# Patient Record
Sex: Female | Born: 1990 | Race: Black or African American | Hispanic: No | Marital: Single | Smoking: Never smoker
Health system: Southern US, Community
[De-identification: ages and names within clinical notes are randomized; demographics above are authoritative.]

## PROBLEM LIST (undated history)

## (undated) DIAGNOSIS — F32A Depression, unspecified: Secondary | ICD-10-CM

## (undated) DIAGNOSIS — R87629 Unspecified abnormal cytological findings in specimens from vagina: Secondary | ICD-10-CM

## (undated) DIAGNOSIS — G43909 Migraine, unspecified, not intractable, without status migrainosus: Secondary | ICD-10-CM

## (undated) DIAGNOSIS — O24419 Gestational diabetes mellitus in pregnancy, unspecified control: Secondary | ICD-10-CM

## (undated) DIAGNOSIS — F419 Anxiety disorder, unspecified: Secondary | ICD-10-CM

## (undated) HISTORY — PX: GALLBLADDER SURGERY: SHX652

## (undated) HISTORY — PX: TONSILECTOMY, ADENOIDECTOMY, BILATERAL MYRINGOTOMY AND TUBES: SHX2538

## (undated) HISTORY — PX: CHOLECYSTECTOMY: SHX55

## (undated) HISTORY — DX: Depression, unspecified: F32.A

## (undated) HISTORY — DX: Migraine, unspecified, not intractable, without status migrainosus: G43.909

## (undated) HISTORY — DX: Gestational diabetes mellitus in pregnancy, unspecified control: O24.419

## (undated) HISTORY — DX: Unspecified abnormal cytological findings in specimens from vagina: R87.629

## (undated) HISTORY — DX: Anxiety disorder, unspecified: F41.9

---

## 2018-09-02 DIAGNOSIS — K047 Periapical abscess without sinus: Secondary | ICD-10-CM | POA: Diagnosis not present

## 2018-09-14 DIAGNOSIS — Z113 Encounter for screening for infections with a predominantly sexual mode of transmission: Secondary | ICD-10-CM | POA: Diagnosis not present

## 2018-11-29 DIAGNOSIS — N94819 Vulvodynia, unspecified: Secondary | ICD-10-CM | POA: Diagnosis not present

## 2018-12-12 DIAGNOSIS — N94819 Vulvodynia, unspecified: Secondary | ICD-10-CM | POA: Diagnosis not present

## 2018-12-17 ENCOUNTER — Other Ambulatory Visit: Payer: Self-pay

## 2018-12-17 ENCOUNTER — Encounter (HOSPITAL_COMMUNITY): Payer: Self-pay | Admitting: Emergency Medicine

## 2018-12-17 ENCOUNTER — Ambulatory Visit (HOSPITAL_COMMUNITY)
Admission: EM | Admit: 2018-12-17 | Discharge: 2018-12-17 | Disposition: A | Discharge status: Home / Self Care | Payer: Medicaid Other | Attending: Family Medicine | Admitting: Family Medicine

## 2018-12-17 DIAGNOSIS — G43109 Migraine with aura, not intractable, without status migrainosus: Secondary | ICD-10-CM | POA: Diagnosis not present

## 2018-12-17 MED ORDER — NAPROXEN 500 MG PO TABS: 500.0000 mg | ORAL_TABLET | Freq: Two times a day (BID) | ORAL | 0 refills | Status: DC

## 2018-12-17 MED ORDER — METOCLOPRAMIDE HCL 5 MG/ML IJ SOLN
5.0000 mg | Freq: Once | INTRAMUSCULAR | Status: AC
  Administered 2018-12-17: 5 mg via INTRAMUSCULAR

## 2018-12-17 MED ORDER — KETOROLAC TROMETHAMINE 60 MG/2ML IM SOLN
60.0000 mg | Freq: Once | INTRAMUSCULAR | Status: AC
  Administered 2018-12-17: 60 mg via INTRAMUSCULAR

## 2018-12-17 MED ORDER — METOCLOPRAMIDE HCL 5 MG/ML IJ SOLN
INTRAMUSCULAR | Status: AC
  Filled 2018-12-17: qty 2

## 2018-12-17 MED ORDER — DEXAMETHASONE SODIUM PHOSPHATE 10 MG/ML IJ SOLN
10.0000 mg | Freq: Once | INTRAMUSCULAR | Status: AC
  Administered 2018-12-17: 10 mg via INTRAMUSCULAR

## 2018-12-17 MED ORDER — DEXAMETHASONE SODIUM PHOSPHATE 10 MG/ML IJ SOLN
INTRAMUSCULAR | Status: AC
  Filled 2018-12-17: qty 1

## 2018-12-17 MED ORDER — KETOROLAC TROMETHAMINE 60 MG/2ML IM SOLN
INTRAMUSCULAR | Status: AC
  Filled 2018-12-17: qty 2

## 2018-12-17 NOTE — ED Triage Notes (Signed)
Per pt she has hx migraines and takes prescription for them. Pt says no relief and also sensitivity to light . Feeling nausea

## 2018-12-17 NOTE — Discharge Instructions (Addendum)
We gave you an injection of Toradol, Decadron and Reglan today for your headache.  They should begin working in 30 to 40 minutes.  You may try Naprosyn twice daily as alternative to your diclofenac.  Please establish care with primary care in order to obtain neurology referral to follow-up.  Follow-up here in the emergency room if headache worsening, changing

## 2018-12-17 NOTE — ED Provider Notes (Signed)
[facility]    CSN: [id number] Arrival date & time: 12/17/18  1040     History   Chief Complaint Chief Complaint  Patient presents with  . Headache    HPI Tanya Mccullough is a 28 y.o. female history of migraines presenting today for evaluation of a headache.  Patient states that beginning yesterday she had a headache on her right frontal region.  When she woke up she had discomfort and migraine on her left frontal region.  She reports associated aura prior to onset.  She has had some mild nausea yesterday, but no persistent nausea or vomiting today.  She has had associated photo phobia.  Also notes some left arm numbness and diarrhea but states that this is typical for her migraines.  She is previously seen by neurology and currently on diclofenac.  She took diclofenac 25 mg at various times throughout the day yesterday without relief.  She is tried ibuprofen 800 mg last night.  She is not on any preventative medicines.  States that headache and symptoms very similar to when she has had headaches in the past.  Notes that she has had headaches off and on since she was 28 years old.  Last headache was 5 months ago.  Denies any vision changes besides the aura preceding headache but this is not been continuous.  Denies numbness or tingling.  Denies chest pain or shortness of breath.  Denies weakness.  HPI  History reviewed. No pertinent past medical history.  There are no active problems to display for this patient.   History reviewed. No pertinent surgical history.  OB History   No obstetric history on file.      Home Medications    Prior to Admission medications   Medication Sig Start Date End Date Taking? Authorizing Provider  naproxen (NAPROSYN) 500 MG tablet Take 1 tablet (500 mg total) by mouth 2 (two) times daily. 12/17/18   , Junius Creamer, PA-C    Family History No family history on file.  Social History Social History   Tobacco Use  . Smoking status: Not  on file  Substance Use Topics  . Alcohol use: Not on file  . Drug use: Not on file     Allergies   Patient has no allergy information on record.   Review of Systems Review of Systems  Constitutional: Negative for fatigue and fever.  HENT: Negative for congestion, sinus pressure and sore throat.   Eyes: Positive for photophobia. Negative for pain and visual disturbance.  Respiratory: Negative for cough and shortness of breath.   Cardiovascular: Negative for chest pain.  Gastrointestinal: Negative for abdominal pain, nausea and vomiting.  Genitourinary: Negative for decreased urine volume and hematuria.  Musculoskeletal: Negative for myalgias, neck pain and neck stiffness.  Neurological: Positive for headaches. Negative for dizziness, syncope, facial asymmetry, speech difficulty, weakness, light-headedness and numbness.     Physical Exam Triage Vital Signs ED Triage Vitals  Enc Vitals Group     BP 12/17/18 1055 112/80     Pulse Rate 12/17/18 1055 78     Resp 12/17/18 1055 16     Temp 12/17/18 1055 99.2 F (37.3 C)     Temp Source 12/17/18 1055 Oral     SpO2 12/17/18 1055 96 %     Weight --      Height --      Head Circumference --      Peak Flow --      Pain Score 12/17/18  1058 8     Pain Loc --      Pain Edu? --      Excl. in GC? --    No data found.  Updated Vital Signs BP 112/80 (BP Location: Left Arm)   Pulse 78   Temp 99.2 F (37.3 C) (Oral)   Resp 16   LMP 11/21/2018   SpO2 96%   Visual Acuity Right Eye Distance:   Left Eye Distance:   Bilateral Distance:    Right Eye Near:   Left Eye Near:    Bilateral Near:     Physical Exam Vitals signs and nursing note reviewed.  Constitutional:      General: She is not in acute distress.    Appearance: She is well-developed.  HENT:     Head: Normocephalic and atraumatic.     Ears:     Comments: Bilateral ears without tenderness to palpation of external auricle, tragus and mastoid, EAC's without  erythema or swelling, TM's with good bony landmarks and cone of light. Non erythematous.     Mouth/Throat:     Comments: Oral mucosa pink and moist, no tonsillar enlargement or exudate. Posterior pharynx patent and nonerythematous, no uvula deviation or swelling. Normal phonation. Palate elevating symmetrically Eyes:     Extraocular Movements: Extraocular movements intact.     Conjunctiva/sclera: Conjunctivae normal.     Pupils: Pupils are equal, round, and reactive to light.     Comments: Mild photophobia with exam  Neck:     Musculoskeletal: Neck supple.  Cardiovascular:     Rate and Rhythm: Normal rate and regular rhythm.     Heart sounds: No murmur.  Pulmonary:     Effort: Pulmonary effort is normal. No respiratory distress.     Breath sounds: Normal breath sounds.  Abdominal:     Palpations: Abdomen is soft.     Tenderness: There is no abdominal tenderness.  Skin:    General: Skin is warm and dry.  Neurological:     General: No focal deficit present.     Mental Status: She is alert and oriented to person, place, and time. Mental status is at baseline.     Cranial Nerves: No cranial nerve deficit.     Motor: No weakness.     Comments: Patient A&O x3, cranial nerves II-XII grossly intact, strength at shoulders, hips and knees 5/5, equal bilaterally, patellar reflex 2+ bilaterally.  Gait without abnormality.      UC Treatments / Results  Labs (all labs ordered are listed, but only abnormal results are displayed) Labs Reviewed - No data to display  EKG None  Radiology No results found.  Procedures Procedures (including critical care time)  Medications Ordered in UC Medications  ketorolac (TORADOL) injection 60 mg (60 mg Intramuscular Given 12/17/18 1211)  metoCLOPramide (REGLAN) injection 5 mg (5 mg Intramuscular Given 12/17/18 1215)  dexamethasone (DECADRON) injection 10 mg (10 mg Intramuscular Given 12/17/18 1213)    Initial Impression / Assessment and Plan / UC  Course  I have reviewed the triage vital signs and the nursing notes.  Pertinent labs & imaging results that were available during my care of the patient were reviewed by me and considered in my medical decision making (see chart for details).     Headache similar to previous headaches, vital signs stable, no neuro deficits.  No red flags.  Will treat headache as migraine with Toradol, Decadron and Reglan in clinic.  Will provide Naprosyn as alternative to use for  further manage take management at home.  Discussed establishing care with primary care/neurology for further follow-up that she recently moved here.  Following up in emergency room if headache changing or worsening, feeling different from typical headaches.  Discussed strict return precautions. Patient verbalized understanding and is agreeable with plan.  Final Clinical Impressions(s) / UC Diagnoses   Final diagnoses:  Migraine with aura and without status migrainosus, not intractable     Discharge Instructions     We gave you an injection of Toradol, Decadron and Reglan today for your headache.  They should begin working in 30 to 40 minutes.  You may try Naprosyn twice daily as alternative to your diclofenac.  Please establish care with primary care in order to obtain neurology referral to follow-up.  Follow-up here in the emergency room if headache worsening, changing   ED Prescriptions    Medication Sig Dispense Auth. Provider   naproxen (NAPROSYN) 500 MG tablet Take 1 tablet (500 mg total) by mouth 2 (two) times daily. 30 tablet , Melia C, PA-C     Controlled Substance Prescriptions [place] Controlled Substance Registry consulted? Not Applicable   Lew Dawes,  C, [place] 12/17/18 1400

## 2018-12-22 ENCOUNTER — Emergency Department (HOSPITAL_COMMUNITY)
Admission: EM | Admit: 2018-12-22 | Discharge: 2018-12-22 | Disposition: A | Discharge status: Home / Self Care | Payer: Medicaid Other | Attending: Emergency Medicine | Admitting: Emergency Medicine

## 2018-12-22 ENCOUNTER — Encounter: Payer: Self-pay | Admitting: Family Medicine

## 2018-12-22 ENCOUNTER — Other Ambulatory Visit: Payer: Self-pay

## 2018-12-22 ENCOUNTER — Encounter (HOSPITAL_COMMUNITY): Payer: Self-pay | Admitting: *Deleted

## 2018-12-22 DIAGNOSIS — R51 Headache: Secondary | ICD-10-CM | POA: Diagnosis present

## 2018-12-22 DIAGNOSIS — G43809 Other migraine, not intractable, without status migrainosus: Secondary | ICD-10-CM | POA: Diagnosis not present

## 2018-12-22 LAB — BASIC METABOLIC PANEL
Anion gap: 12 (ref 5–15)
BUN: 8 mg/dL (ref 6–20)
CO2: 23 mmol/L (ref 22–32)
Calcium: 9.4 mg/dL (ref 8.9–10.3)
Chloride: 104 mmol/L (ref 98–111)
Creatinine, Ser: 0.7 mg/dL (ref 0.44–1.00)
GFR calc Af Amer: >60 mL/min (ref >60)
GFR calc non Af Amer: >60 mL/min (ref >60)
Glucose, Bld: 83 mg/dL (ref 70–99)
Potassium: 3.8 mmol/L (ref 3.5–5.1)
Sodium: 139 mmol/L (ref 135–145)

## 2018-12-22 LAB — CBC WITH DIFFERENTIAL/PLATELET
Abs Immature Granulocytes: 0.03 10*3/uL (ref 0.00–0.07)
Basophils Absolute: 0.1 10*3/uL (ref 0.0–0.1)
Basophils Relative: 1 %
Eosinophils Absolute: 0.4 10*3/uL (ref 0.0–0.5)
Eosinophils Relative: 4 %
HCT: 40.7 % (ref 36.0–46.0)
Hemoglobin: 14.6 g/dL (ref 12.0–15.0)
Immature Granulocytes: 0 %
Lymphocytes Relative: 38 %
Lymphs Abs: 4 10*3/uL (ref 0.7–4.0)
MCH: 28.3 pg (ref 26.0–34.0)
MCHC: 35.9 g/dL (ref 30.0–36.0)
MCV: 78.9 fL — ABNORMAL LOW (ref 80.0–100.0)
Monocytes Absolute: 0.7 10*3/uL (ref 0.1–1.0)
Monocytes Relative: 7 %
Neutro Abs: 5.4 10*3/uL (ref 1.7–7.7)
Neutrophils Relative %: 50 %
Platelets: 251 10*3/uL (ref 150–400)
RBC: 5.16 MIL/uL — ABNORMAL HIGH (ref 3.87–5.11)
RDW: 13.5 % (ref 11.5–15.5)
WBC: 10.6 10*3/uL — ABNORMAL HIGH (ref 4.0–10.5)
nRBC: 0 % (ref 0.0–0.2)

## 2018-12-22 LAB — I-STAT BETA HCG BLOOD, ED (MC, WL, AP ONLY): I-stat hCG, quantitative: <5 m[IU]/mL (ref <5)

## 2018-12-22 MED ORDER — DIPHENHYDRAMINE HCL 50 MG/ML IJ SOLN
25.0000 mg | Freq: Once | INTRAMUSCULAR | Status: AC
  Administered 2018-12-22: 21:00:00 25 mg via INTRAVENOUS
  Filled 2018-12-22: qty 1

## 2018-12-22 MED ORDER — METOCLOPRAMIDE HCL 5 MG/ML IJ SOLN
10.0000 mg | Freq: Once | INTRAMUSCULAR | Status: AC
  Administered 2018-12-22: 21:00:00 10 mg via INTRAVENOUS
  Filled 2018-12-22: qty 2

## 2018-12-22 MED ORDER — SODIUM CHLORIDE 0.9 % IV BOLUS
1000.0000 mL | Freq: Once | INTRAVENOUS | Status: AC
  Administered 2018-12-22: 21:00:00 1000 mL via INTRAVENOUS

## 2018-12-22 MED ORDER — KETOROLAC TROMETHAMINE 30 MG/ML IJ SOLN
30.0000 mg | Freq: Once | INTRAMUSCULAR | Status: AC
  Administered 2018-12-22: 21:00:00 30 mg via INTRAVENOUS
  Filled 2018-12-22: qty 1

## 2018-12-22 MED ORDER — DEXAMETHASONE SODIUM PHOSPHATE 10 MG/ML IJ SOLN
10.0000 mg | Freq: Once | INTRAMUSCULAR | Status: AC
  Administered 2018-12-22: 21:00:00 10 mg via INTRAVENOUS
  Filled 2018-12-22: qty 1

## 2018-12-22 NOTE — ED Provider Notes (Signed)
[facility]   CSN: [id number] Arrival date & time: 12/22/18  1713    History   Chief Complaint Chief Complaint  Patient presents with  . Migraine    HPI Tanya Mccullough is a 28 y.o. female.     Patient is a 28 year old female with past medical history of migraine headaches since the age of 89.  Patient presents today with complaints of headache.  It started this morning and has been unrelieved with diclofenac.  This was prescribed for her at urgent care earlier this week.  Patient reports stabbing pain to the left side of her head.  She denies any visual disturbances.  She denies any fevers, chills, or stiff neck.  The history is provided by the patient.  Migraine  This is a recurrent problem. Episode onset: this morning. The problem occurs constantly. The problem has been rapidly worsening. Associated symptoms include headaches. Nothing aggravates the symptoms. Nothing relieves the symptoms. She has tried nothing for the symptoms. The treatment provided no relief.    Past Medical History:  Diagnosis Date  . Migraines     There are no active problems to display for this patient.   Past Surgical History:  Procedure Laterality Date  . CHOLECYSTECTOMY    . TONSILECTOMY, ADENOIDECTOMY, BILATERAL MYRINGOTOMY AND TUBES       OB History   No obstetric history on file.      Home Medications    Prior to Admission medications   Medication Sig Start Date End Date Taking? Authorizing Provider  naproxen (NAPROSYN) 500 MG tablet Take 1 tablet (500 mg total) by mouth 2 (two) times daily. 12/17/18   Wieters, Junius CreamerHallie C, PA-C    Family History No family history on file.  Social History Social History   Tobacco Use  . Smoking status: Never Smoker  . Smokeless tobacco: Never Used  Substance Use Topics  . Alcohol use: Yes  . Drug use: Not on file     Allergies   Prochlorperazine   Review of Systems Review of Systems   Neurological: Positive for headaches.  All other systems reviewed and are negative.    Physical Exam Updated Vital Signs BP 121/74 (BP Location: Right Arm)   Pulse 70   Temp 98.5 F (36.9 C) (Oral)   Resp 16   Ht 5\' 7"  (1.702 m)   Wt 69.9 kg   LMP 12/22/2018 (Exact Date)   SpO2 100%   BMI 24.12 kg/m   Physical Exam Vitals signs and nursing note reviewed.  Constitutional:      General: She is not in acute distress.    Appearance: She is well-developed. She is not diaphoretic.  HENT:     Head: Normocephalic and atraumatic.  Eyes:     Extraocular Movements: Extraocular movements intact.     Pupils: Pupils are equal, round, and reactive to light.  Neck:     Musculoskeletal: Normal range of motion and neck supple.  Cardiovascular:     Rate and Rhythm: Normal rate and regular rhythm.     Heart sounds: No murmur. No friction rub. No gallop.   Pulmonary:     Effort: Pulmonary effort is normal. No respiratory distress.     Breath sounds: Normal breath sounds. No wheezing.  Abdominal:     General: Bowel sounds are normal. There is no distension.     Palpations: Abdomen is soft.     Tenderness: There is no abdominal tenderness.  Musculoskeletal: Normal range  of motion.  Skin:    General: Skin is warm and dry.  Neurological:     General: No focal deficit present.     Mental Status: She is alert and oriented to person, place, and time.     Cranial Nerves: No cranial nerve deficit.     Sensory: No sensory deficit.     Motor: No weakness.     Coordination: Coordination normal.      ED Treatments / Results  Labs (all labs ordered are listed, but only abnormal results are displayed) Labs Reviewed  BASIC METABOLIC PANEL  CBC WITH DIFFERENTIAL/PLATELET  I-STAT BETA HCG BLOOD, ED ([facility], [facility], [facility] ONLY)    EKG None  Radiology No results found.  Procedures Procedures (including critical care time)  Medications Ordered in ED Medications  sodium chloride 0.9 % bolus  1,000 mL (has no administration in time range)  ketorolac (TORADOL) 30 MG/ML injection 30 mg (has no administration in time range)  metoCLOPramide (REGLAN) injection 10 mg (has no administration in time range)  diphenhydrAMINE (BENADRYL) injection 25 mg (has no administration in time range)  dexamethasone (DECADRON) injection 10 mg (has no administration in time range)     Initial Impression / Assessment and Plan / ED Course  I have reviewed the triage vital signs and the nursing notes.  Pertinent labs & imaging results that were available during my care of the patient were reviewed by me and considered in my medical decision making (see chart for details).  Patient presenting with complaints of headache.  This is consistent with her prior migraines, however not improving with usual measures.  She was given a migraine cocktail and IV fluids and is feeling much better.  At this point I see no indication for imaging or further work-up.  Patient will be discharged and is to follow-up as needed.  Final Clinical Impressions(s) / ED Diagnoses   Final diagnoses:  None    ED Discharge Orders    None       Geoffery Lyons, MD 12/22/18 2258

## 2018-12-22 NOTE — Discharge Instructions (Addendum)
Continue medications as previously prescribed.  Return to the ER for worsening headache, high fever, or other new and concerning symptoms.

## 2018-12-22 NOTE — ED Triage Notes (Signed)
Pt in c/o migraine onset since Friday, pt seen at [facility], pt reports waking up this morning at 7am and was not able to see out of R eye with hx of the same with her migraines, pt denies relief with Naproxen, pt sees a neurologist for migraines, pt denies current n/v/d, A&O x4, no facial droop, no slurred speech, A&O x4

## 2018-12-27 ENCOUNTER — Telehealth: Payer: Self-pay | Admitting: Family Medicine

## 2018-12-27 ENCOUNTER — Telehealth: Payer: Medicaid Other | Admitting: Family Medicine

## 2018-12-27 ENCOUNTER — Inpatient Hospital Stay: Payer: Medicaid Other | Admitting: Family Medicine

## 2018-12-27 ENCOUNTER — Telehealth (INDEPENDENT_AMBULATORY_CARE_PROVIDER_SITE_OTHER): Payer: Medicaid Other | Admitting: Family Medicine

## 2018-12-27 DIAGNOSIS — G43109 Migraine with aura, not intractable, without status migrainosus: Secondary | ICD-10-CM

## 2018-12-27 DIAGNOSIS — G4459 Other complicated headache syndrome: Secondary | ICD-10-CM | POA: Diagnosis not present

## 2018-12-27 MED ORDER — SUMATRIPTAN SUCCINATE 100 MG PO TABS: 100.0000 mg | ORAL_TABLET | ORAL | 2 refills | Status: DC | PRN

## 2018-12-27 MED ORDER — TOPIRAMATE 25 MG PO TABS: 25.0000 mg | ORAL_TABLET | Freq: Every day | ORAL | 1 refills | Status: DC

## 2018-12-27 NOTE — Progress Notes (Signed)
Virtual Visit via Video Note  I connected with Tanya Mccullough on 12/27/18 at  2:30 PM EDT by a video enabled telemedicine application and verified that I am speaking with the correct person using two identifiers.  Location: Patient: Located at home during today's encounter  Provider: Located at primary care office    I discussed the limitations of evaluation and management by telemedicine and the availability of in person appointments. The patient expressed understanding and agreed to proceed.  History of Present Illness: Followed by neurologist in [facility] at [facility] previously.  Recently moved to [place].  Has had migraines since she was 28 years old.  Most recent medication regimen consisted of gabapentin 3 times daily she stopped after experiencing episodes of memory loss which continues to occur since being off medication for over several months.  She is previously was prescribed Topamax felt the medication worked, patient was discontinued to start her on gabapentin which was thought to be more effective.  She is also been on Maxalt for headache management.  She is never been on Imitrex.  She was scheduled for a CT however this was canceled due to COVID.  She is concerned as she feels these headaches are different than the headaches that she has had previously.  She presented to the ER on 12/22/18 with a complaint of a left sided headaches which she describes as a stabbing pain. She was prescribed Diclofenac for headache relief by a previous provider but reports medication did not relieve pain. While in the ER she was administered a headache cocktail which relieved symptoms temporarily but endorses daily frequent headaches which occur regardless of activity. She endorses feeling the memory is "off" and doesn't feel like herself and endorses that this recent set of headaches are different than previous.  Assessment and Plan: 1. Migraine with aura and without status  migrainosus, not intractable Obtaining a CT of head without contrast.  Restart preventative headache treatment with low dose Topamax 25 mg once daily.  Will trial sumatriptan 100 mg every 2 hours not to exceed 200 mg within 24 hours for abortive treatment of headaches. - Ambulatory referral to Neurology  2. Other complicated headache syndrome - CT Head Wo Contrast; Future  Follow Up Instructions: CPE with fasting labs 02/14/19-PAP if needed    I discussed the assessment and treatment plan with the patient. The patient was provided an opportunity to ask questions and all were answered. The patient agreed with the plan and demonstrated an understanding of the instructions.   The patient was advised to call back or seek an in-person evaluation if the symptoms worsen or if the condition fails to improve as anticipated.  I provided 25 minutes of non-face-to-face time during this encounter.   Joaquin Courts, FNP

## 2018-12-27 NOTE — Telephone Encounter (Signed)
Please schedule CT of head w/o contrast.  Order placed in system. Patient has not preference of day. Locations: [facility] or [facility]

## 2018-12-28 MED ORDER — TOPIRAMATE 25 MG PO TABS: 25.0000 mg | ORAL_TABLET | Freq: Every day | ORAL | 1 refills | Status: DC

## 2018-12-28 NOTE — Telephone Encounter (Signed)
Case requires additional clinical review. [employer] is requesting office note be faxed to [phone]. Case # [id number].  Once your note is finished I can fax.

## 2018-12-28 NOTE — Telephone Encounter (Signed)
Called [employer]([phone]) to initiate a PA for CT.

## 2018-12-29 ENCOUNTER — Encounter: Payer: Self-pay | Admitting: *Deleted

## 2018-12-29 ENCOUNTER — Telehealth: Payer: Self-pay | Admitting: *Deleted

## 2018-12-29 NOTE — Telephone Encounter (Signed)
Office note completed.

## 2018-12-29 NOTE — Telephone Encounter (Signed)
Confirmation received. Called [employer] to verify receipt. It is being reviewed by the clinical nurse.

## 2018-12-29 NOTE — Telephone Encounter (Signed)
Sent MyChart message to patient to let her know the status of imaging PA.

## 2018-12-29 NOTE — Telephone Encounter (Signed)
Spoke with patient and updated EMR. 

## 2018-12-29 NOTE — Telephone Encounter (Signed)
Office note faxed to [employer]([phone]). Awaiting confirmation.

## 2019-01-02 ENCOUNTER — Other Ambulatory Visit: Payer: Self-pay

## 2019-01-02 ENCOUNTER — Ambulatory Visit (INDEPENDENT_AMBULATORY_CARE_PROVIDER_SITE_OTHER): Payer: Medicaid Other | Admitting: Diagnostic Neuroimaging

## 2019-01-02 ENCOUNTER — Encounter: Payer: Self-pay | Admitting: Diagnostic Neuroimaging

## 2019-01-02 ENCOUNTER — Telehealth: Payer: Self-pay | Admitting: Diagnostic Neuroimaging

## 2019-01-02 DIAGNOSIS — G4489 Other headache syndrome: Secondary | ICD-10-CM | POA: Diagnosis not present

## 2019-01-02 MED ORDER — GALCANEZUMAB-GNLM 120 MG/ML ~~LOC~~ SOAJ: 120.0000 mg | SUBCUTANEOUS | 4 refills | Status: DC

## 2019-01-02 MED ORDER — RIZATRIPTAN BENZOATE 10 MG PO TBDP: 10.0000 mg | ORAL_TABLET | ORAL | 11 refills | Status: DC | PRN

## 2019-01-02 NOTE — Telephone Encounter (Signed)
Patient seen by neurology today. Neurologist cancelled CT and ordered MRI.

## 2019-01-02 NOTE — Telephone Encounter (Signed)
medicaid order sent to GI. They will obtain the auth and reach out to the pt to schedule.  °

## 2019-01-02 NOTE — Progress Notes (Signed)
[facility]  PATIENT: Tanya Mccullough DOB: 08/20/1990  REFERRING CLINICIAN: Colin Mulders, FNP HISTORY FROM: patient  REASON FOR VISIT: new consult    HISTORICAL  CHIEF COMPLAINT:  No chief complaint on file.   HISTORY OF PRESENT ILLNESS:   28 year old female here for evaluation of headaches.  Patient has had headaches since age 60 years old with unilateral throbbing pounding severe headaches.  These are associated with nausea, photophobia and phonophobia.  Also associated with confusion and slurred speech.  These are oftentimes preceded by seeing flashing lights in one eye and unilateral numbness in the face and arm.  Headaches can last hours at a time.  She was living in [place] previously and tried on Topamax, gabapentin, Imitrex, diclofenac with mild relief.  He was having some cognitive side effects with migraine prevention medicines and gradually came off these medicines 5 to 6 months ago.  In last 1 month patient has had increasing, recurrent headaches, with change in severity and features.  Now patient having some muscle twitching and daily headaches.  Patient has tried Topamax and Imitrex without relief.  She went to the emergency room for evaluation.  Patient is almost having daily headaches now.    REVIEW OF SYSTEMS: Full 14 system review of systems performed and negative with exception of: As per HPI.  ALLERGIES: Allergies  Allergen Reactions  . Prochlorperazine Other (See Comments)    Lip twitching    HOME MEDICATIONS: Outpatient Medications Prior to Visit  Medication Sig Dispense Refill  . naproxen (NAPROSYN) 500 MG tablet Take 1 tablet (500 mg total) by mouth 2 (two) times daily. 30 tablet 0  . SUMAtriptan (IMITREX) 100 MG tablet Take 1 tablet (100 mg total) by mouth every 2 (two) hours as needed for migraine (Do not exceed 200 mg within 24 hours.). May repeat in 2 hours if headache persists or recurs. 10 tablet 2  . topiramate (TOPAMAX) 25 MG  tablet Take 1 tablet (25 mg total) by mouth daily. 90 tablet 1   No facility-administered medications prior to visit.     PAST MEDICAL HISTORY: Past Medical History:  Diagnosis Date  . Migraines     PAST SURGICAL HISTORY: Past Surgical History:  Procedure Laterality Date  . CHOLECYSTECTOMY    . TONSILECTOMY, ADENOIDECTOMY, BILATERAL MYRINGOTOMY AND TUBES      FAMILY HISTORY: Family History  Problem Relation Age of Onset  . Healthy Mother   . Other Father        murdered  . Migraines Cousin     SOCIAL HISTORY: Social History   Socioeconomic History  . Marital status: Single    Spouse name: Not on file  . Number of children: 2  . Years of education: Not on file  . Highest education level: Bachelor's degree (e.g., BA, AB, BS)  Occupational History    Comment: medical admin  Social Needs  . Financial resource strain: Not on file  . Food insecurity:    Worry: Not on file    Inability: Not on file  . Transportation needs:    Medical: Not on file    Non-medical: Not on file  Tobacco Use  . Smoking status: Never Smoker  . Smokeless tobacco: Never Used  Substance and Sexual Activity  . Alcohol use: Yes    Comment: 1 glass every days  . Drug use: Never  . Sexual activity: Not on file  Lifestyle  . Physical activity:    Days per week: Not on file  Minutes per session: Not on file  . Stress: Not on file  Relationships  . Social connections:    Talks on phone: Not on file    Gets together: Not on file    Attends religious service: Not on file    Active member of club or organization: Not on file    Attends meetings of clubs or organizations: Not on file    Relationship status: Not on file  . Intimate partner violence:    Fear of current or ex partner: Not on file    Emotionally abused: Not on file    Physically abused: Not on file    Forced sexual activity: Not on file  Other Topics Concern  . Not on file  Social History Narrative   Lives with 2 sons    Caffeine- rare soda     PHYSICAL EXAM   VIDEO EXAM  GENERAL EXAM/CONSTITUTIONAL:  Vitals: There were no vitals filed for this visit.  There is no height or weight on file to calculate BMI. Wt Readings from Last 3 Encounters:  12/22/18 154 lb (69.9 kg)     Patient is in no distress; well developed, nourished and groomed; neck is supple   NEUROLOGIC: MENTAL STATUS:  No flowsheet data found.  awake, alert, oriented to person, place and time  recent and remote memory intact  normal attention and concentration  language fluent, comprehension intact, naming intact  fund of knowledge appropriate  CRANIAL NERVE:   2nd, 3rd, 4th, 6th - visual fields full to confrontation, extraocular muscles intact, no nystagmus  5th - facial sensation symmetric  7th - facial strength symmetric  8th - hearing intact  11th - shoulder shrug symmetric  12th - tongue protrusion midline  MOTOR:   NO TREMOR; NO DRIFT IN BUE  SENSORY:   normal and symmetric to light touch  COORDINATION:   fine finger movements normal    DIAGNOSTIC DATA (LABS, IMAGING, TESTING) - I reviewed patient records, labs, notes, testing and imaging myself where available.  Lab Results  Component Value Date   WBC 10.6 (H) 12/22/2018   HGB 14.6 12/22/2018   HCT 40.7 12/22/2018   MCV 78.9 (L) 12/22/2018   PLT 251 12/22/2018      Component Value Date/Time   NA 139 12/22/2018 2030   K 3.8 12/22/2018 2030   CL 104 12/22/2018 2030   CO2 23 12/22/2018 2030   GLUCOSE 83 12/22/2018 2030   BUN 8 12/22/2018 2030   CREATININE 0.70 12/22/2018 2030   CALCIUM 9.4 12/22/2018 2030   GFRNONAA >60 12/22/2018 2030   GFRAA >60 12/22/2018 2030   No results found for: CHOL, HDL, LDLCALC, LDLDIRECT, TRIG, CHOLHDL No results found for: WJXB1Y No results found for: VITAMINB12 No results found for: TSH    ASSESSMENT AND PLAN  28 y.o. year old female here with migraine with aura since age 73 years old; now  with increasing and changing type of headaches.   Meds tried: topiramate, gabapentin, diclofenac, sumatriptan  Dx: migraine with aura  1. Other headache syndrome    Virtual Visit via Video Note  I connected with Tanya Mccullough on 01/02/19 at  9:00 AM EDT by a video enabled telemedicine application and verified that I am speaking with the correct person using two identifiers.  Location: Patient: home  Provider: office   I discussed the limitations of evaluation and management by telemedicine and the availability of in person appointments. The patient expressed understanding and agreed to proceed.  I discussed the assessment and treatment plan with the patient. The patient was provided an opportunity to ask questions and all were answered. The patient agreed with the plan and demonstrated an understanding of the instructions.   The patient was advised to call back or seek an in-person evaluation if the symptoms worsen or if the condition fails to improve as anticipated.  I provided 30 minutes of non-face-to-face time during this encounter.    PLAN:  - check MRI brain (increasing, worsening HA) - will cancel previously ordered CT  - MIGRAINE PREVENTION --> start emgality monthly injections  - MIGRAINE RESCUE --> rizatriptan 10mg  as needed for breakthrough headache; may repeat x 1 after 2 hours; max 2 tabs per day or 8 per month   Orders Placed This Encounter  Procedures  . MR BRAIN W WO CONTRAST   Meds ordered this encounter  Medications  . Galcanezumab-gnlm (EMGALITY) 120 MG/ML SOAJ    Sig: Inject 120 mg into the skin every 30 (thirty) days.    Dispense:  3 pen    Refill:  4  . rizatriptan (MAXALT-MLT) 10 MG disintegrating tablet    Sig: Take 1 tablet (10 mg total) by mouth as needed for migraine. May repeat in 2 hours if needed    Dispense:  9 tablet    Refill:  11   Return in about 3 months (around 04/04/2019) for with NP (Amy Lomax).    Suanne MarkerVIKRAM R. Mariel Lukins, MD  01/02/2019, 9:23 AM Certified in Neurology, Neurophysiology and Neuroimaging  [facility] [address], [address] [place], [place] [id number] [phone]

## 2019-01-02 NOTE — Telephone Encounter (Signed)
Prior Authorization was denied.  Request was denied b/c the requested imaging is not supported for the evaluation of a primary headache disorder in the absence of focal neurological deficits. The clinical information provided does not describe a condition other than a primary headache disorder and, therefore, the request is not indicated at this time.

## 2019-01-04 ENCOUNTER — Telehealth: Payer: Self-pay | Admitting: *Deleted

## 2019-01-04 NOTE — Telephone Encounter (Signed)
Received fax from [facility]: rizatriptan needs PA. [place] [employer] Triptan PA form filled out, on dr [employer] for review, signature.

## 2019-01-10 NOTE — Telephone Encounter (Signed)
Faxed Rizatriptan PA form to [facility].

## 2019-01-12 NOTE — Telephone Encounter (Addendum)
Per [place] [employer] web site, rizatriptan was denied. I called patient to advise. She stated that the pharmacist called her a few days ago and advised insurance probably denied triptan because she had recently picked up sumatriptan. Patient stated she is going to talk to pharmacist today, return sumatriptan and have pharmacist try to fill rizatriptan. She will call back if she has any issues. She verbalized understanding, appreciation.

## 2019-01-20 ENCOUNTER — Ambulatory Visit
Admission: RE | Admit: 2019-01-20 | Discharge: 2019-01-20 | Disposition: A | Discharge status: Home / Self Care | Payer: Medicaid Other | Source: Ambulatory Visit | Attending: Diagnostic Neuroimaging | Admitting: Diagnostic Neuroimaging

## 2019-01-20 ENCOUNTER — Other Ambulatory Visit: Payer: Self-pay

## 2019-01-20 DIAGNOSIS — G4489 Other headache syndrome: Secondary | ICD-10-CM

## 2019-01-20 MED ORDER — GADOBENATE DIMEGLUMINE 529 MG/ML IV SOLN
6.0000 mL | Freq: Once | INTRAVENOUS | Status: AC | PRN
  Administered 2019-01-20: 6 mL via INTRAVENOUS

## 2019-01-23 NOTE — Telephone Encounter (Signed)
Medicaid Josem Kaufmann: [id number] (exp. 01/17/19 to 07/16/19) patient had mri at GI on 01/20/19.

## 2019-02-02 ENCOUNTER — Telehealth: Payer: Self-pay | Admitting: Diagnostic Neuroimaging

## 2019-02-02 NOTE — Telephone Encounter (Addendum)
[facility], spoke with pharmacist Sarah and advised her of patient's call. She stated the patient picked up 3 pens on  01/02/19. The patient should have 1 pen to inject today; the next injection will be due July 18th. Judson Roch stated the patient is trying to refill too soon, and since the original Rx did not specify to take two injections the first month, insurance is denying the refill. Judson Roch took a [facility] for 2 pens and advised I call patient. She will need to call [facility] in  [facility] July for next refill. At that time [facility] can use verbal Rx from today, then pick up on previous refills.  I called patient and advised her of above conversation. She verbalized understanding, appreciation.

## 2019-02-02 NOTE — Telephone Encounter (Signed)
Pt has called re: her Galcanezumab-gnlm (EMGALITY) 120 MG/ML SOAJ.  She states her 1st dose required that she take 2 at 1 time.  Pt states the pharmacy told her to call the office and have something sent to them because she would not be able to get a refill this soon.  Please call

## 2019-02-14 ENCOUNTER — Ambulatory Visit: Payer: Medicaid Other | Admitting: Family Medicine

## 2019-02-14 ENCOUNTER — Telehealth: Payer: Self-pay

## 2019-02-14 NOTE — Telephone Encounter (Signed)
Called patient to do their pre-visit COVID screening.  Patient states that her menstrual cycle started this morning & she would like to reschedule appointment once cycle has ended so that she doesn't have to make multiple trips.

## 2019-02-15 ENCOUNTER — Ambulatory Visit: Payer: Medicaid Other | Admitting: Family Medicine

## 2019-02-28 ENCOUNTER — Telehealth: Payer: Self-pay | Admitting: *Deleted

## 2019-02-28 NOTE — Telephone Encounter (Signed)
LVM informing patient her MRI brain results are unremarkable. I requested she call back and schedule her FU with A Lomax, NP.

## 2019-04-11 ENCOUNTER — Ambulatory Visit: Payer: Medicaid Other | Admitting: Family Medicine

## 2019-04-11 NOTE — Progress Notes (Deleted)
PATIENT: Tanya Mccullough DOB: 07/12/1991  REASON FOR VISIT: follow up HISTORY FROM: patient  No chief complaint on file.    HISTORY OF PRESENT ILLNESS: Today 04/11/19 Tanya Mccullough is a 28 y.o. female here today for follow up for headches  She continues topiramate 25mg  daily as well as Emgality monthly. She is using rizatriptan for abortive therapy.    HISTORY: (copied from Dr Richrd HumblesPenumalli's note on 01/02/2019)  [age] year old female here for evaluation of headaches.  Patient has had headaches since age 28 years old with unilateral throbbing pounding severe headaches.  These are associated with nausea, photophobia and phonophobia.  Also associated with confusion and slurred speech.  These are oftentimes preceded by seeing flashing lights in one eye and unilateral numbness in the face and arm.  Headaches can last hours at a time.  She was living in [place] previously and tried on Topamax, gabapentin, Imitrex, diclofenac with mild relief.  He was having some cognitive side effects with migraine prevention medicines and gradually came off these medicines 5 to 6 months ago.  In last 1 month patient has had increasing, recurrent headaches, with change in severity and features.  Now patient having some muscle twitching and daily headaches.  Patient has tried Topamax and Imitrex without relief.  She went to the emergency room for evaluation.  Patient is almost having daily headaches now.   REVIEW OF SYSTEMS: Out of a complete 14 system review of symptoms, the patient complains only of the following symptoms, and all other reviewed systems are negative.  ALLERGIES: Allergies  Allergen Reactions  . Prochlorperazine Other (See Comments)    Lip twitching    HOME MEDICATIONS: Outpatient Medications Prior to Visit  Medication Sig Dispense Refill  . Galcanezumab-gnlm (EMGALITY) 120 MG/ML SOAJ Inject 120 mg into the skin every 30 (thirty) days. 3 pen 4  . naproxen (NAPROSYN) 500 MG tablet  Take 1 tablet (500 mg total) by mouth 2 (two) times daily. 30 tablet 0  . rizatriptan (MAXALT-MLT) 10 MG disintegrating tablet Take 1 tablet (10 mg total) by mouth as needed for migraine. May repeat in 2 hours if needed 9 tablet 11  . SUMAtriptan (IMITREX) 100 MG tablet Take 1 tablet (100 mg total) by mouth every 2 (two) hours as needed for migraine (Do not exceed 200 mg within 24 hours.). May repeat in 2 hours if headache persists or recurs. 10 tablet 2  . topiramate (TOPAMAX) 25 MG tablet Take 1 tablet (25 mg total) by mouth daily. 90 tablet 1   No facility-administered medications prior to visit.     PAST MEDICAL HISTORY: Past Medical History:  Diagnosis Date  . Migraines     PAST SURGICAL HISTORY: Past Surgical History:  Procedure Laterality Date  . CHOLECYSTECTOMY    . TONSILECTOMY, ADENOIDECTOMY, BILATERAL MYRINGOTOMY AND TUBES      FAMILY HISTORY: Family History  Problem Relation Age of Onset  . Healthy Mother   . Other Father        murdered  . Migraines Cousin     SOCIAL HISTORY: Social History   Socioeconomic History  . Marital status: Single    Spouse name: Not on file  . Number of children: 2  . Years of education: Not on file  . Highest education level: Bachelor's degree (e.g., BA, AB, BS)  Occupational History    Comment: medical admin  Social Needs  . Financial resource strain: Not on file  . Food insecurity    Worry: Not on  file    Inability: Not on file  . Transportation needs    Medical: Not on file    Non-medical: Not on file  Tobacco Use  . Smoking status: Never Smoker  . Smokeless tobacco: Never Used  Substance and Sexual Activity  . Alcohol use: Yes    Comment: 1 glass every days  . Drug use: Never  . Sexual activity: Not on file  Lifestyle  . Physical activity    Days per week: Not on file    Minutes per session: Not on file  . Stress: Not on file  Relationships  . Social Musician on phone: Not on file    Gets  together: Not on file    Attends religious service: Not on file    Active member of club or organization: Not on file    Attends meetings of clubs or organizations: Not on file    Relationship status: Not on file  . Intimate partner violence    Fear of current or ex partner: Not on file    Emotionally abused: Not on file    Physically abused: Not on file    Forced sexual activity: Not on file  Other Topics Concern  . Not on file  Social History Narrative   Lives with 2 sons   Caffeine- rare soda      PHYSICAL EXAM  There were no vitals filed for this visit. There is no height or weight on file to calculate BMI.  Generalized: Well developed, in no acute distress  Cardiology: normal rate and rhythm, no murmur noted Neurological examination  Mentation: Alert oriented to time, place, history taking. Follows all commands speech and language fluent Cranial nerve II-XII: Pupils were equal round reactive to light. Extraocular movements were full, visual field were full on confrontational test. Facial sensation and strength were normal. Uvula tongue midline. Head turning and shoulder shrug  were normal and symmetric. Motor: The motor testing reveals 5 over 5 strength of all 4 extremities. Good symmetric motor tone is noted throughout.  Sensory: Sensory testing is intact to soft touch on all 4 extremities. No evidence of extinction is noted.  Coordination: Cerebellar testing reveals good finger-nose-finger and heel-to-shin bilaterally.  Gait and station: Gait is normal. Tandem gait is normal. Romberg is negative. No drift is seen.  Reflexes: Deep tendon reflexes are symmetric and normal bilaterally.   DIAGNOSTIC DATA (LABS, IMAGING, TESTING) - I reviewed patient records, labs, notes, testing and imaging myself where available.  No flowsheet data found.   Lab Results  Component Value Date   WBC 10.6 (H) 12/22/2018   HGB 14.6 12/22/2018   HCT 40.7 12/22/2018   MCV 78.9 (L) 12/22/2018    PLT 251 12/22/2018      Component Value Date/Time   NA 139 12/22/2018 2030   K 3.8 12/22/2018 2030   CL 104 12/22/2018 2030   CO2 23 12/22/2018 2030   GLUCOSE 83 12/22/2018 2030   BUN 8 12/22/2018 2030   CREATININE 0.70 12/22/2018 2030   CALCIUM 9.4 12/22/2018 2030   GFRNONAA >60 12/22/2018 2030   GFRAA >60 12/22/2018 2030   No results found for: CHOL, HDL, LDLCALC, LDLDIRECT, TRIG, CHOLHDL No results found for: LSLH7D No results found for: VITAMINB12 No results found for: TSH     ASSESSMENT AND PLAN 28 y.o. year old female  has a past medical history of Migraines. here with ***  No diagnosis found.     No orders  of the defined types were placed in this encounter.    No orders of the defined types were placed in this encounter.     I spent 15 minutes with the patient. 50% of this time was spent counseling and educating patient on plan of care and medications.    Debbora Presto, FNP-C 04/11/2019, 12:56 PM [facility] [address], [place], [place] [address] [phone]

## 2019-04-12 ENCOUNTER — Other Ambulatory Visit: Payer: Self-pay | Admitting: Diagnostic Neuroimaging

## 2019-04-18 ENCOUNTER — Telehealth (INDEPENDENT_AMBULATORY_CARE_PROVIDER_SITE_OTHER): Payer: Medicaid Other | Admitting: Family Medicine

## 2019-04-18 ENCOUNTER — Encounter: Payer: Self-pay | Admitting: Family Medicine

## 2019-04-18 VITALS — Ht 67.0 in

## 2019-04-18 DIAGNOSIS — G4489 Other headache syndrome: Secondary | ICD-10-CM

## 2019-04-18 MED ORDER — EMGALITY 120 MG/ML ~~LOC~~ SOSY: 120.0000 mg | PREFILLED_SYRINGE | SUBCUTANEOUS | 11 refills | Status: DC

## 2019-04-18 NOTE — Progress Notes (Signed)
   PATIENT: Tanya Mccullough DOB: 01/28/1991  REASON FOR VISIT: follow up HISTORY FROM: patient  Virtual Visit via Telephone Note  I connected with Tanya Mccullough on 04/18/19 at  9:30 AM EDT by telephone and verified that I am speaking with the correct person using two identifiers.   I discussed the limitations, risks, security and privacy concerns of performing an evaluation and management service by telephone and the availability of in person appointments. I also discussed with the patient that there may be a patient responsible charge related to this service. The patient expressed understanding and agreed to proceed.   History of Present Illness:  04/18/19 Tanya Mccullough is a 28 y.o. female here today for follow up for migraines. She was started on Emgality and rizatriptan at last visit. She has had complete resolution of her migraines. No headaches in over 2 months. She took rizatriptan once and it worked well. No adverse effects with medications. She is feeling well and without concerns today.   History (copied from Dr Gladstone Lighter note on 01/02/2019)  28 year old female here for evaluation of headaches.  Patient has had headaches since age 45 years old with unilateral throbbing pounding severe headaches.  These are associated with nausea, photophobia and phonophobia.  Also associated with confusion and slurred speech.  These are oftentimes preceded by seeing flashing lights in one eye and unilateral numbness in the face and arm.  Headaches can last hours at a time.  She was living in [place] previously and tried on Topamax, gabapentin, Imitrex, diclofenac with mild relief.  He was having some cognitive side effects with migraine prevention medicines and gradually came off these medicines 5 to 6 months ago.  In last 1 month patient has had increasing, recurrent headaches, with change in severity and features.  Now patient having some muscle twitching and daily headaches.  Patient has  tried Topamax and Imitrex without relief.  She went to the emergency room for evaluation.  Patient is almost having daily headaches now.   Observations/Objective:  Generalized: Well developed, in no acute distress  Mentation: Alert oriented to time, place, history taking. Follows all commands speech and language fluent   Assessment and Plan:  28 y.o. year old female  has a past medical history of Migraines. here with    ICD-10-CM   1. Other headache syndrome  G44.89    She is doing very well on Emgality and rizatriptan. She has had complete resolution of headaches. We will continue current treatment plan. Follow up advised annually.  Appointment has been scheduled with patient today.  She verbalizes understanding and agreement with this plan.    No orders of the defined types were placed in this encounter.   No orders of the defined types were placed in this encounter.    Follow Up Instructions:  I discussed the assessment and treatment plan with the patient. The patient was provided an opportunity to ask questions and all were answered. The patient agreed with the plan and demonstrated an understanding of the instructions.   The patient was advised to call back or seek an in-person evaluation if the symptoms worsen or if the condition fails to improve as anticipated.  I provided 15 minutes of non-face-to-face time during this encounter.  Patient is located at her place of residence during my chart visit.  Provider is located in the office.   Debbora Presto, NP

## 2019-04-18 NOTE — Addendum Note (Signed)
Addended byUbaldo Glassing, Christy Friede L on: 04/18/2019 01:01 PM   Modules accepted: Orders

## 2019-04-18 NOTE — Progress Notes (Signed)
I reviewed note and agree with plan.   Penni Bombard, MD 08/23/7937, 0:30 PM Certified in Neurology, Neurophysiology and Neuroimaging  [facility] [address], [place], [place] [address] [phone]

## 2019-05-12 ENCOUNTER — Other Ambulatory Visit: Payer: Self-pay

## 2019-05-12 ENCOUNTER — Encounter (HOSPITAL_COMMUNITY): Payer: Self-pay

## 2019-05-12 ENCOUNTER — Ambulatory Visit (HOSPITAL_COMMUNITY)
Admission: EM | Admit: 2019-05-12 | Discharge: 2019-05-12 | Disposition: A | Discharge status: Home / Self Care | Payer: Managed Care, Other (non HMO) | Attending: Family Medicine | Admitting: Family Medicine

## 2019-05-12 ENCOUNTER — Encounter: Payer: Self-pay | Admitting: Family Medicine

## 2019-05-12 DIAGNOSIS — G43111 Migraine with aura, intractable, with status migrainosus: Secondary | ICD-10-CM

## 2019-05-12 MED ORDER — METOCLOPRAMIDE HCL 5 MG/ML IJ SOLN
INTRAMUSCULAR | Status: AC
  Filled 2019-05-12: qty 2

## 2019-05-12 MED ORDER — DEXAMETHASONE SODIUM PHOSPHATE 10 MG/ML IJ SOLN
10.0000 mg | Freq: Once | INTRAMUSCULAR | Status: AC
  Administered 2019-05-12: 10 mg via INTRAMUSCULAR

## 2019-05-12 MED ORDER — DEXAMETHASONE SODIUM PHOSPHATE 10 MG/ML IJ SOLN
INTRAMUSCULAR | Status: AC
  Filled 2019-05-12: qty 1

## 2019-05-12 MED ORDER — KETOROLAC TROMETHAMINE 60 MG/2ML IM SOLN
60.0000 mg | Freq: Once | INTRAMUSCULAR | Status: AC
  Administered 2019-05-12: 60 mg via INTRAMUSCULAR

## 2019-05-12 MED ORDER — METOCLOPRAMIDE HCL 5 MG/ML IJ SOLN
5.0000 mg | Freq: Once | INTRAMUSCULAR | Status: AC
  Administered 2019-05-12: 5 mg via INTRAMUSCULAR

## 2019-05-12 MED ORDER — KETOROLAC TROMETHAMINE 60 MG/2ML IM SOLN
INTRAMUSCULAR | Status: AC
  Filled 2019-05-12: qty 2

## 2019-05-12 NOTE — Discharge Instructions (Addendum)
Treated you here with a migraine cocktail. Follow-up with your neurologist

## 2019-05-12 NOTE — ED Triage Notes (Signed)
Pt presents with recurrent migraine with photo sensitivity and nausea X 3 days.

## 2019-05-12 NOTE — ED Provider Notes (Signed)
[facility]    CSN: [id number] Arrival date & time: 05/12/19  1309      History   Chief Complaint Chief Complaint  Patient presents with  . Appointment  . (1:30) Migraine    HPI Tanya Mccullough is a 28 y.o. female.   Patient is a 28 year old female with past medical history of migraines.  She is presenting today with a migraine.  Reports that this is been present and worsening over the past 3 days.  She has been taking rizatriptan but the headache still remains.  She has had some photophobia, nausea and intermittent numbness and tingling.  This is pretty consistent with her migraines.  She contacted her neurologist but has not heard back.  Mild frontal headache with dizziness.  Some sinus congestion.  No fever, cough, chest congestion.   ROS per HPI      Past Medical History:  Diagnosis Date  . Migraines     There are no active problems to display for this patient.   Past Surgical History:  Procedure Laterality Date  . CHOLECYSTECTOMY    . TONSILECTOMY, ADENOIDECTOMY, BILATERAL MYRINGOTOMY AND TUBES      OB History   No obstetric history on file.      Home Medications    Prior to Admission medications   Medication Sig Start Date End Date Taking? Authorizing Provider  Galcanezumab-gnlm (EMGALITY) 120 MG/ML SOSY Inject 120 mg into the skin every 30 (thirty) days. 04/18/19   Lomax, Amy, NP  naproxen (NAPROSYN) 500 MG tablet Take 1 tablet (500 mg total) by mouth 2 (two) times daily. 12/17/18   Wieters, Hallie C, PA-C  rizatriptan (MAXALT-MLT) 10 MG disintegrating tablet Take 1 tablet (10 mg total) by mouth as needed for migraine. May repeat in 2 hours if needed 01/02/19   Penumalli, Earlean Polka, MD    Family History Family History  Problem Relation Age of Onset  . Healthy Mother   . Other Father        murdered  . Migraines Cousin     Social History Social History   Tobacco Use  . Smoking status: Never Smoker  . Smokeless tobacco: Never Used   Substance Use Topics  . Alcohol use: Yes    Comment: 1 glass every days  . Drug use: Never     Allergies   Prochlorperazine   Review of Systems Review of Systems   Physical Exam Triage Vital Signs ED Triage Vitals [05/12/19 1329]  Enc Vitals Group     BP 124/81     Pulse Rate 66     Resp 17     Temp 98.6 F (37 C)     Temp Source Oral     SpO2 100 %     Weight      Height      Head Circumference      Peak Flow      Pain Score 9     Pain Loc      Pain Edu?      Excl. in [place]?    No data found.  Updated Vital Signs BP 124/81 (BP Location: Left Arm)   Pulse 66   Temp 98.6 F (37 C) (Oral)   Resp 17   LMP 05/07/2019   SpO2 100%   Visual Acuity Right Eye Distance:   Left Eye Distance:   Bilateral Distance:    Right Eye Near:   Left Eye Near:    Bilateral Near:  Physical Exam Vitals signs and nursing note reviewed.  Constitutional:      General: She is not in acute distress.    Appearance: Normal appearance. She is not ill-appearing, toxic-appearing or diaphoretic.     Comments: Very pleasant in no distress.   HENT:     Head: Normocephalic and atraumatic.     Nose: Nose normal.     Mouth/Throat:     Pharynx: Oropharynx is clear.  Eyes:     Extraocular Movements: Extraocular movements intact.     Conjunctiva/sclera: Conjunctivae normal.     Pupils: Pupils are equal, round, and reactive to light.  Neck:     Musculoskeletal: Normal range of motion.  Pulmonary:     Effort: Pulmonary effort is normal.  Musculoskeletal: Normal range of motion.  Skin:    General: Skin is warm and dry.  Neurological:     General: No focal deficit present.     Mental Status: She is alert.     Comments: Strength 5 out of 5 in all extremities.  No focal neuro deficits. Speech clear and without facial droop.  Psychiatric:        Mood and Affect: Mood normal.      UC Treatments / Results  Labs (all labs ordered are listed, but only abnormal results are  displayed) Labs Reviewed - No data to display  EKG   Radiology No results found.  Procedures Procedures (including critical care time)  Medications Ordered in UC Medications  ketorolac (TORADOL) injection 60 mg (60 mg Intramuscular Given 05/12/19 1413)  metoCLOPramide (REGLAN) injection 5 mg (5 mg Intramuscular Given 05/12/19 1413)  dexamethasone (DECADRON) injection 10 mg (10 mg Intramuscular Given 05/12/19 1413)  dexamethasone (DECADRON) 10 MG/ML injection (has no administration in time range)  ketorolac (TORADOL) 60 MG/2ML injection (has no administration in time range)  metoCLOPramide (REGLAN) 5 MG/ML injection (has no administration in time range)    Initial Impression / Assessment and Plan / UC Course  I have reviewed the triage vital signs and the nursing notes.  Pertinent labs & imaging results that were available during my care of the patient were reviewed by me and considered in my medical decision making (see chart for details).     Persistent migraine- treating here with migraine cocktail.  No focal neuro deficits.  No concerning signs or symptoms for CVA  Patient feeling much better after cocktail with headache 1 out of 10. Recommended follow-up with her primary care or neurologist for further problems.  Final Clinical Impressions(s) / UC Diagnoses   Final diagnoses:  Intractable migraine with aura with status migrainosus     Discharge Instructions     Treated you here with a migraine cocktail. Follow-up with your neurologist    ED Prescriptions    None     PDMP not reviewed this encounter.   Janace Aris, NP 05/12/19 1450

## 2019-05-15 ENCOUNTER — Encounter: Payer: Self-pay | Admitting: Family Medicine

## 2019-05-15 ENCOUNTER — Telehealth: Payer: Self-pay | Admitting: Family Medicine

## 2019-05-15 NOTE — Telephone Encounter (Signed)
I called pt and made offering of VV or in office visit for her tomorrow at 0930, 1030 or 1130.

## 2019-05-15 NOTE — Telephone Encounter (Signed)
Can you please get her scheduled for visit with me?

## 2019-05-15 NOTE — Telephone Encounter (Signed)
My chart message sent for pt that appt available with AL/MP 0930 or 1030.

## 2019-05-18 ENCOUNTER — Ambulatory Visit: Payer: Medicaid Other | Admitting: Family Medicine

## 2019-05-23 ENCOUNTER — Telehealth: Payer: Medicaid Other | Admitting: Family Medicine

## 2019-05-23 ENCOUNTER — Telehealth: Payer: Self-pay | Admitting: Family Medicine

## 2019-05-23 NOTE — Telephone Encounter (Signed)
Patient did not log in to MyChart visit as scheduled at 8am. I called at 8:13am. LM on VM to return call to reschedule if needed.

## 2019-05-23 NOTE — Progress Notes (Deleted)
   PATIENT: Paisley Grajeda DOB: [birth year]  REASON FOR VISIT: follow up HISTORY FROM: patient  Virtual Visit via Telephone Note  I connected with Duanne Limerick on 05/23/19 at  8:00 AM EDT by telephone and verified that I am speaking with the correct person using two identifiers.   I discussed the limitations, risks, security and privacy concerns of performing an evaluation and management service by telephone and the availability of in person appointments. I also discussed with the patient that there may be a patient responsible charge related to this service. The patient expressed understanding and agreed to proceed.   History of Present Illness:  05/23/19 Saidee Geremia is a 28 y.o. female here today for follow up for migraines. Since last being seen, she had a migraine that was persistent for . She was seen by UC after trying rizatriptan, benadryl and Aleve without relief.    History (copied from my note on 04/18/2019)  Niyana Chesbro is a 28 y.o. female here today for follow up for migraines. She was started on Emgality and rizatriptan at last visit. She has had complete resolution of her migraines. No headaches in over 2 months. She took rizatriptan once and it worked well. No adverse effects with medications. She is feeling well and without concerns today.   History (copied from Dr Gladstone Lighter note on 01/02/2019)  28 year old female here for evaluation of headaches.  Patient has had headaches since age 84 years old with unilateral throbbing pounding severe headaches. These are associated with nausea, photophobia and phonophobia. Also associated with confusion and slurred speech. These are oftentimes preceded by seeing flashing lights in one eye and unilateral numbness in the face and arm. Headaches can last hours at a time. She was living in [place] previously and tried on Topamax, gabapentin, Imitrex, diclofenac with mild relief. He was having some cognitive side effects  with migraine prevention medicines and gradually came off these medicines 5 to 6 months ago.  In last 1 month patient has had increasing, recurrent headaches, with change in severity and features. Now patient having some muscle twitching and daily headaches. Patient has tried Topamax and Imitrex without relief. She went to the emergency room for evaluation. Patient is almost having daily headaches now.    Observations/Objective:  Generalized: Well developed, in no acute distress  Mentation: Alert oriented to time, place, history taking. Follows all commands speech and language fluent   Assessment and Plan:  28 y.o. year old female  has a past medical history of Migraines. here with  No diagnosis found.  No orders of the defined types were placed in this encounter.   No orders of the defined types were placed in this encounter.    Follow Up Instructions:  I discussed the assessment and treatment plan with the patient. The patient was provided an opportunity to ask questions and all were answered. The patient agreed with the plan and demonstrated an understanding of the instructions.   The patient was advised to call back or seek an in-person evaluation if the symptoms worsen or if the condition fails to improve as anticipated.  I provided *** minutes of non-face-to-face time during this encounter.   Debbora Presto, NP

## 2019-05-29 ENCOUNTER — Telehealth: Payer: Self-pay

## 2019-05-29 NOTE — Telephone Encounter (Signed)
Called [facility] for Pts Emgality 120mg /ml syringes PA.  Reference Numbers:   First reference Number: [id number] Second reference Number: [id number]  PA Key [facility]... [id number]

## 2019-06-05 ENCOUNTER — Encounter: Payer: Self-pay | Admitting: Family Medicine

## 2019-06-06 NOTE — Telephone Encounter (Signed)
I received approval for emgality on [facility] portal this am.  PA # [id number] ID [id number] T.  05-29-19 thru 11-25-2019.  Length 180 days.  Total quantity -6.  I received fax/verbal from Judson Roch at pharmacy received approval [phone]. Emgality.  I responded to pt to let her know by mychart that was approved and sarah at pharmacy will call [facility] about not being able to process.  She will call back as needed.

## 2019-06-28 NOTE — Progress Notes (Signed)
PATIENT: Tanya Mccullough DOB: [birth year]  REASON FOR VISIT: follow up HISTORY FROM: patient  Virtual Visit via Telephone Note  I connected with Duanne Mccullough on 06/29/19 at  9:00 AM EST by telephone and verified that I am speaking with the correct person using two identifiers.   I discussed the limitations, risks, security and privacy concerns of performing an evaluation and management service by telephone and the availability of in person appointments. I also discussed with the patient that there may be a patient responsible charge related to this service. The patient expressed understanding and agreed to proceed.   History of Present Illness:  06/29/19 Tanya Mccullough is a 28 y.o. female here today for follow up for migraines. She was seen last on 9/1 and doing very well on Emgality. She had a migraine on 9/25 that was unresponsive to rizatriptan. She was given a migraine cocktail of Toradol, Reglan and Decadron. Migraine nearly resolved with treatment. About a month later, she had another migraine that was persistent for 4-5 days. She found relief with benadryl and Sudafed. She has not had any headaches since. Rizatriptan usually helps to abort migraines. She takes naproxen as needed. BP running 120/80's. No new or concerning symptoms.   Medications tried: Emgality, topiramate, gabapentin, diclofenac, Imitrex   History (copied from my note on 04/18/2019)  Tanya Mccullough is a 28 y.o. female here today for follow up for migraines. She was started on Emgality and rizatriptan at last visit. She has had complete resolution of her migraines. No headaches in over 2 months. She took rizatriptan once and it worked well. No adverse effects with medications. She is feeling well and without concerns today.   History (copied from Dr Gladstone Lighter note on 01/02/2019)  28 year old female here for evaluation of headaches.  Patient has had headaches since age 27 years old with unilateral throbbing  pounding severe headaches. These are associated with nausea, photophobia and phonophobia. Also associated with confusion and slurred speech. These are oftentimes preceded by seeing flashing lights in one eye and unilateral numbness in the face and arm. Headaches can last hours at a time. She was living in [place] previously and tried on Topamax, gabapentin, Imitrex, diclofenac with mild relief. He was having some cognitive side effects with migraine prevention medicines and gradually came off these medicines 5 to 6 months ago.  In last 1 month patient has had increasing, recurrent headaches, with change in severity and features. Now patient having some muscle twitching and daily headaches. Patient has tried Topamax and Imitrex without relief. She went to the emergency room for evaluation. Patient is almost having daily headaches now.   Observations/Objective:  Generalized: Well developed, in no acute distress  Mentation: Alert oriented to time, place, history taking. Follows all commands speech and language fluent   Assessment and Plan:  28 y.o. year old female  has a past medical history of Migraines. here with    ICD-10-CM   1. Other headache syndrome  G44.89    Tanya Mccullough reports 2 separate events over the last 3 months where she had a persistent migraine for 4 to 5 days.  Event in September required treatment with migraine cocktail.  Most recently migraine resolved with antihistamine and decongestant.  We have discussed several factors that could be related to worsening of migraines.  She feels that migraines could be related to weather changes.  He wishes to continue current therapy with Emgality monthly and rizatriptan as needed for abortive therapy.  We have discussed the  possibility of adding propranolol if migraines continue to be persistent.  May also consider switching rizatriptan to Nurtec if needed.  She will continue to monitor symptoms closely.  Adequate hydration and  healthy well-balanced diet advised.  She will follow-up in 1 year, sooner if needed for any worsening of symptoms.  She verbalizes understanding and agreement with this plan.   No orders of the defined types were placed in this encounter.   No orders of the defined types were placed in this encounter.    Follow Up Instructions:  I discussed the assessment and treatment plan with the patient. The patient was provided an opportunity to ask questions and all were answered. The patient agreed with the plan and demonstrated an understanding of the instructions.   The patient was advised to call back or seek an in-person evaluation if the symptoms worsen or if the condition fails to improve as anticipated.  I provided 15 minutes of non-face-to-face time during this encounter.  Patient is located at her place of residence.  Provider is located in the office.   [name], NP

## 2019-06-29 ENCOUNTER — Encounter: Payer: Self-pay | Admitting: Family Medicine

## 2019-06-29 ENCOUNTER — Telehealth (INDEPENDENT_AMBULATORY_CARE_PROVIDER_SITE_OTHER): Payer: Managed Care, Other (non HMO) | Admitting: Family Medicine

## 2019-06-29 DIAGNOSIS — G4489 Other headache syndrome: Secondary | ICD-10-CM | POA: Diagnosis not present

## 2019-06-30 NOTE — Progress Notes (Signed)
I reviewed note and agree with plan.   Penni Bombard, MD 01/56/1537, 9:43 AM Certified in Neurology, Neurophysiology and Neuroimaging  [facility] [address], [place], [place] [address] [phone]

## 2019-08-18 NOTE — L&D Delivery Note (Addendum)
OB/GYN Faculty Practice Delivery Note  Tanya Mccullough is a 29 y.o. [id number] s/p vaginal delivery at [redacted]w[redacted]d. 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  ROM: 1h 73m with clear fluid GBS Status: negative Maximum Maternal Temperature: 99.59F  Labor Progress: On admission, pt was noted to be dilated to 5cm and was subsequently started on pitocin. AROM was performed at 0411 for clear fluid, and she was noted to be completely dilated at 0547. She then delivered without complication as noted below s/p a brief second stage.  Delivery Date/Time: [phone] on 04/17/20 Delivery: Called to room and patient was complete and pushing. Head delivered LOA. No nuchal cord present. Shoulder and body delivered in usual fashion. Infant with spontaneous cry, placed on mother's abdomen, dried and stimulated. Cord clamped x 2 after 1-minute delay, and cut by FOB under my direct supervision. Cord blood drawn. Placenta delivered spontaneously with gentle cord traction. Fundus firm with massage and Pitocin. Labia, perineum, vagina, and cervix were inspected without evidence of lacerations.   Placenta: intact, 3-vessel cord Complications: none Lacerations: none EBL: 50 ml Analgesia: none  Infant: female  APGARs 8 and 9 at 1 and 5 minutes respectively  weight pending  Lynnda Shields, MD OB/GYN Fellow, Faculty Practice

## 2019-08-31 ENCOUNTER — Encounter: Payer: Self-pay | Admitting: Family Medicine

## 2019-09-01 ENCOUNTER — Telehealth: Payer: Self-pay

## 2019-09-01 NOTE — Telephone Encounter (Signed)

## 2019-09-04 ENCOUNTER — Ambulatory Visit (INDEPENDENT_AMBULATORY_CARE_PROVIDER_SITE_OTHER): Payer: Managed Care, Other (non HMO) | Admitting: Internal Medicine

## 2019-09-04 ENCOUNTER — Other Ambulatory Visit: Payer: Self-pay

## 2019-09-04 ENCOUNTER — Encounter: Payer: Self-pay | Admitting: Internal Medicine

## 2019-09-04 VITALS — BP 118/80 | HR 67 | Temp 97.3°F | Resp 17 | Ht 67.0 in | Wt 158.4 lb

## 2019-09-04 DIAGNOSIS — Z3201 Encounter for pregnancy test, result positive: Secondary | ICD-10-CM

## 2019-09-04 DIAGNOSIS — R5383 Other fatigue: Secondary | ICD-10-CM

## 2019-09-04 DIAGNOSIS — Z3200 Encounter for pregnancy test, result unknown: Secondary | ICD-10-CM

## 2019-09-04 LAB — POCT URINE PREGNANCY: Preg Test, Ur: POSITIVE — AB

## 2019-09-04 NOTE — Patient Instructions (Signed)
Your due date is 05/07/20 and you are currently 4 weeks 6 days pregnant.    First Trimester of Pregnancy  The first trimester of pregnancy is from week 1 until the end of week 13 (months 1 through 3). During this time, your baby will begin to develop inside you. At 6-8 weeks, the eyes and face are formed, and the heartbeat can be seen on ultrasound. At the end of 12 weeks, all the baby's organs are formed. Prenatal care is all the medical care you receive before the birth of your baby. Make sure you get good prenatal care and follow all of your doctor's instructions. Follow these instructions at home: Medicines  Take over-the-counter and prescription medicines only as told by your doctor. Some medicines are safe and some medicines are not safe during pregnancy.  Take a prenatal vitamin that contains at least 600 micrograms (mcg) of folic acid.  If you have trouble pooping (constipation), take medicine that will make your stool soft (stool softener) if your doctor approves. Eating and drinking   Eat regular, healthy meals.  Your doctor will tell you the amount of weight gain that is right for you.  Avoid raw meat and uncooked cheese.  If you feel sick to your stomach (nauseous) or throw up (vomit): ? Eat 4 or 5 small meals a day instead of 3 large meals. ? Try eating a few soda crackers. ? Drink liquids between meals instead of during meals.  To prevent constipation: ? Eat foods that are high in fiber, like fresh fruits and vegetables, whole grains, and beans. ? Drink enough fluids to keep your pee (urine) clear or pale yellow. Activity  Exercise only as told by your doctor. Stop exercising if you have cramps or pain in your lower belly (abdomen) or low back.  Do not exercise if it is too hot, too humid, or if you are in a place of great height (high altitude).  Try to avoid standing for long periods of time. Move your legs often if you must stand in one place for a long  time.  Avoid heavy lifting.  Wear low-heeled shoes. Sit and stand up straight.  You can have sex unless your doctor tells you not to. Relieving pain and discomfort  Wear a good support bra if your breasts are sore.  Take warm water baths (sitz baths) to soothe pain or discomfort caused by hemorrhoids. Use hemorrhoid cream if your doctor says it is okay.  Rest with your legs raised if you have leg cramps or low back pain.  If you have puffy, bulging veins (varicose veins) in your legs: ? Wear support hose or compression stockings as told by your doctor. ? Raise (elevate) your feet for 15 minutes, 3-4 times a day. ? Limit salt in your food. Prenatal care  Schedule your prenatal visits by the twelfth week of pregnancy.  Write down your questions. Take them to your prenatal visits.  Keep all your prenatal visits as told by your doctor. This is important. Safety  Wear your seat belt at all times when driving.  Make a list of emergency phone numbers. The list should include numbers for family, friends, the hospital, and police and fire departments. General instructions  Ask your doctor for a referral to a local prenatal class. Begin classes no later than at the start of month 6 of your pregnancy.  Ask for help if you need counseling or if you need help with nutrition. Your doctor can give  you advice or tell you where to go for help.  Do not use hot tubs, steam rooms, or saunas.  Do not douche or use tampons or scented sanitary pads.  Do not cross your legs for long periods of time.  Avoid all herbs and alcohol. Avoid drugs that are not approved by your doctor.  Do not use any tobacco products, including cigarettes, chewing tobacco, and electronic cigarettes. If you need help quitting, ask your doctor. You may get counseling or other support to help you quit.  Avoid cat litter boxes and soil used by cats. These carry germs that can cause birth defects in the baby and can cause  a loss of your baby (miscarriage) or stillbirth.  Visit your dentist. At home, brush your teeth with a soft toothbrush. Be gentle when you floss. Contact a doctor if:  You are dizzy.  You have mild cramps or pressure in your lower belly.  You have a nagging pain in your belly area.  You continue to feel sick to your stomach, you throw up, or you have watery poop (diarrhea).  You have a bad smelling fluid coming from your vagina.  You have pain when you pee (urinate).  You have increased puffiness (swelling) in your face, hands, legs, or ankles. Get help right away if:  You have a fever.  You are leaking fluid from your vagina.  You have spotting or bleeding from your vagina.  You have very bad belly cramping or pain.  You gain or lose weight rapidly.  You throw up blood. It may look like coffee grounds.  You are around people who have [place] measles, fifth disease, or chickenpox.  You have a very bad headache.  You have shortness of breath.  You have any kind of trauma, such as from a fall or a car accident. Summary  The first trimester of pregnancy is from week 1 until the end of week 13 (months 1 through 3).  To take care of yourself and your unborn baby, you will need to eat healthy meals, take medicines only if your doctor tells you to do so, and do activities that are safe for you and your baby.  Keep all follow-up visits as told by your doctor. This is important as your doctor will have to ensure that your baby is healthy and growing well. This information is not intended to replace advice given to you by your health care provider. Make sure you discuss any questions you have with your health care provider. Document Revised: 11/24/2018 Document Reviewed: 08/11/2016 Elsevier Patient Education  2020 [employer].

## 2019-09-04 NOTE — Progress Notes (Addendum)
  Subjective:    Tanya Mccullough - 29 y.o. female MRN [id number]  Date of birth: 11-Mar-1991  HPI  Tanya Mccullough G2P2 who is here for confirmation of pregnancy. LMP 08/01/19. Two positive home pregnancy tests on 1/13 and 1/16. She is feeling fatigued but otherwise no significant symptoms of early pregnancy. Denies problems with prior pregnancies. This pregnancy was not planned. Intends to keep child.     -  reports that she has never smoked. She has never used smokeless tobacco. - Review of Systems: Per HPI. - Past Medical History: There are no problems to display for this patient.  - Medications: reviewed and updated   Objective:   Physical Exam Temp (!) 97.3 F (36.3 C) (Temporal)   Resp 17   Ht 5\' 7"  (1.702 m)   Wt 158 lb 6.4 oz (71.8 kg)   LMP 08/01/2019 (Exact Date)   BMI 24.81 kg/m  Physical Exam  Constitutional: She is well-developed, well-nourished, and in no distress. No distress.  Pulmonary/Chest: Effort normal. No respiratory distress.  Psychiatric: Affect and judgment normal.           Assessment & Plan:   1. Visit for confirmation of pregnancy test result with physical exam - POCT urine pregnancy - Ambulatory referral to Obstetrics / Gynecology  2. Positive pregnancy test Discussed that while I will eventually be providing OB care at this clinic, we currently do not have proper supplies to provide OB care at this time. Have placed a referral to [facility] for pregnancy care. Advised to ask about Emgality injections for migraine use during pregnancy. Try Tylenol for migraines first prior to Triptan use.  - Ambulatory referral to Obstetrics / Gynecology    [facility], D.O. 09/04/2019, 8:40 AM Primary Care at [facility]

## 2019-09-10 ENCOUNTER — Inpatient Hospital Stay (HOSPITAL_COMMUNITY)
Admission: AD | Admit: 2019-09-10 | Discharge: 2019-09-10 | Disposition: A | Discharge status: Home / Self Care | Payer: BLUE CROSS/BLUE SHIELD | Attending: Obstetrics & Gynecology | Admitting: Obstetrics & Gynecology

## 2019-09-10 ENCOUNTER — Inpatient Hospital Stay (HOSPITAL_COMMUNITY): Payer: BLUE CROSS/BLUE SHIELD

## 2019-09-10 ENCOUNTER — Other Ambulatory Visit: Payer: Self-pay

## 2019-09-10 ENCOUNTER — Encounter (HOSPITAL_COMMUNITY): Payer: Self-pay | Admitting: Obstetrics & Gynecology

## 2019-09-10 DIAGNOSIS — Z3A01 Less than 8 weeks gestation of pregnancy: Secondary | ICD-10-CM | POA: Diagnosis not present

## 2019-09-10 DIAGNOSIS — R1032 Left lower quadrant pain: Secondary | ICD-10-CM | POA: Insufficient documentation

## 2019-09-10 DIAGNOSIS — O3481 Maternal care for other abnormalities of pelvic organs, first trimester: Secondary | ICD-10-CM | POA: Insufficient documentation

## 2019-09-10 DIAGNOSIS — O26891 Other specified pregnancy related conditions, first trimester: Secondary | ICD-10-CM

## 2019-09-10 DIAGNOSIS — R109 Unspecified abdominal pain: Secondary | ICD-10-CM

## 2019-09-10 DIAGNOSIS — N83202 Unspecified ovarian cyst, left side: Secondary | ICD-10-CM

## 2019-09-10 DIAGNOSIS — O26899 Other specified pregnancy related conditions, unspecified trimester: Secondary | ICD-10-CM

## 2019-09-10 LAB — CBC WITH DIFFERENTIAL/PLATELET
Abs Immature Granulocytes: 0.02 10*3/uL (ref 0.00–0.07)
Basophils Absolute: 0.1 10*3/uL (ref 0.0–0.1)
Basophils Relative: 0 %
Eosinophils Absolute: 0.3 10*3/uL (ref 0.0–0.5)
Eosinophils Relative: 2 %
HCT: 39.2 % (ref 36.0–46.0)
Hemoglobin: 14.4 g/dL (ref 12.0–15.0)
Immature Granulocytes: 0 %
Lymphocytes Relative: 22 %
Lymphs Abs: 2.6 10*3/uL (ref 0.7–4.0)
MCH: 29 pg (ref 26.0–34.0)
MCHC: 36.7 g/dL — ABNORMAL HIGH (ref 30.0–36.0)
MCV: 78.9 fL — ABNORMAL LOW (ref 80.0–100.0)
Monocytes Absolute: 0.9 10*3/uL (ref 0.1–1.0)
Monocytes Relative: 7 %
Neutro Abs: 7.9 10*3/uL — ABNORMAL HIGH (ref 1.7–7.7)
Neutrophils Relative %: 69 %
Platelets: 226 10*3/uL (ref 150–400)
RBC: 4.97 MIL/uL (ref 3.87–5.11)
RDW: 13.2 % (ref 11.5–15.5)
WBC: 11.7 10*3/uL — ABNORMAL HIGH (ref 4.0–10.5)
nRBC: 0 % (ref 0.0–0.2)

## 2019-09-10 LAB — COMPREHENSIVE METABOLIC PANEL
ALT: 52 U/L — ABNORMAL HIGH (ref 0–44)
AST: 55 U/L — ABNORMAL HIGH (ref 15–41)
Albumin: 3.9 g/dL (ref 3.5–5.0)
Alkaline Phosphatase: 63 U/L (ref 38–126)
Anion gap: 10 (ref 5–15)
BUN: 11 mg/dL (ref 6–20)
CO2: 20 mmol/L — ABNORMAL LOW (ref 22–32)
Calcium: 9.6 mg/dL (ref 8.9–10.3)
Chloride: 108 mmol/L (ref 98–111)
Creatinine, Ser: 1.1 mg/dL — ABNORMAL HIGH (ref 0.44–1.00)
GFR calc Af Amer: >60 mL/min (ref >60)
GFR calc non Af Amer: >60 mL/min (ref >60)
Glucose, Bld: 93 mg/dL (ref 70–99)
Potassium: 4.3 mmol/L (ref 3.5–5.1)
Sodium: 138 mmol/L (ref 135–145)
Total Bilirubin: <0.1 mg/dL — ABNORMAL LOW (ref 0.3–1.2)
Total Protein: 7.2 g/dL (ref 6.5–8.1)

## 2019-09-10 LAB — URINALYSIS, ROUTINE W REFLEX MICROSCOPIC
Bilirubin Urine: NEGATIVE
Glucose, UA: NEGATIVE mg/dL
Hgb urine dipstick: NEGATIVE
Ketones, ur: NEGATIVE mg/dL
Leukocytes,Ua: NEGATIVE
Nitrite: NEGATIVE
Protein, ur: NEGATIVE mg/dL
Specific Gravity, Urine: 1.002 — ABNORMAL LOW (ref 1.005–1.030)
pH: 7 (ref 5.0–8.0)

## 2019-09-10 LAB — WET PREP, GENITAL
Sperm: NONE SEEN
Trich, Wet Prep: NONE SEEN
Yeast Wet Prep HPF POC: NONE SEEN

## 2019-09-10 LAB — ABO/RH: ABO/RH(D): AB POS

## 2019-09-10 LAB — HCG, QUANTITATIVE, PREGNANCY: hCG, Beta Chain, Quant, S: 13845 m[IU]/mL — ABNORMAL HIGH (ref <5)

## 2019-09-10 LAB — HIV ANTIBODY (ROUTINE TESTING W REFLEX): HIV Screen 4th Generation wRfx: NONREACTIVE

## 2019-09-10 NOTE — MAU Provider Note (Signed)
History     CSN: [id number]  Arrival date and time: 09/10/19 0007   First Provider Initiated Contact with Patient 09/10/19 0046      No chief complaint on file.  HPI   Ms.Pandora Mccrackin is a 29 y.o. female [id number] @ Unknown certain of her LMP here with LLQ pain that started 3 days ago. The pain is worse when she lays on her left side. When she changes positions to her right side of sits up the pain subsides. She had 2 episodes of spotting last week on Monday and Thursday, however none since then. She has not taken anything for the pain. No N/v.   OB History    Gravida  3   Para  2   Term  2   Preterm  0   AB  0   Living  2     SAB  0   TAB  0   Ectopic  0   Multiple  0   Live Births  2           Past Medical History:  Diagnosis Date  . Migraines     Past Surgical History:  Procedure Laterality Date  . CHOLECYSTECTOMY    . TONSILECTOMY, ADENOIDECTOMY, BILATERAL MYRINGOTOMY AND TUBES      Family History  Problem Relation Age of Onset  . Healthy Mother   . Other Father        murdered  . Migraines Cousin     Social History   Tobacco Use  . Smoking status: Never Smoker  . Smokeless tobacco: Never Used  Substance Use Topics  . Alcohol use: Not Currently    Comment: 1 glass every days  . Drug use: Never    Allergies:  Allergies  Allergen Reactions  . Prochlorperazine Other (See Comments)    Lip twitching    Medications Prior to Admission  Medication Sig Dispense Refill Last Dose  . Galcanezumab-gnlm (EMGALITY) 120 MG/ML SOSY Inject 120 mg into the skin every 30 (thirty) days. 1 mL 11   . rizatriptan (MAXALT-MLT) 10 MG disintegrating tablet Take 1 tablet (10 mg total) by mouth as needed for migraine. May repeat in 2 hours if needed 9 tablet 11    No results found for this or any previous visit (from the past 48 hour(s)).   Recent Results (from the past 2160 hour(s))  POCT urine pregnancy     Status: Abnormal   Collection Time:  09/04/19  8:46 AM  Result Value Ref Range   Preg Test, Ur Positive (A) Negative  GC/Chlamydia probe amp (Trevose)not at [facility]     Status: None   Collection Time: 09/10/19 12:11 AM  Result Value Ref Range   Neisseria Gonorrhea Negative    Chlamydia Negative    Comment Normal Reference Ranger Chlamydia - Negative    Comment      Normal Reference Range Neisseria Gonorrhea - Negative  CBC with Differential/Platelet     Status: Abnormal   Collection Time: 09/10/19 12:32 AM  Result Value Ref Range   WBC 11.7 (H) 4.0 - 10.5 K/uL   RBC 4.97 3.87 - 5.11 MIL/uL   Hemoglobin 14.4 12.0 - 15.0 g/dL   HCT 39.2 36.0 - 46.0 %   MCV 78.9 (L) 80.0 - 100.0 fL   MCH 29.0 26.0 - 34.0 pg   MCHC 36.7 (H) 30.0 - 36.0 g/dL   RDW 13.2 11.5 - 15.5 %   Platelets 226 150 - 400 K/uL  nRBC 0.0 0.0 - 0.2 %   Neutrophils Relative % 69 %   Neutro Abs 7.9 (H) 1.7 - 7.7 K/uL   Lymphocytes Relative 22 %   Lymphs Abs 2.6 0.7 - 4.0 K/uL   Monocytes Relative 7 %   Monocytes Absolute 0.9 0.1 - 1.0 K/uL   Eosinophils Relative 2 %   Eosinophils Absolute 0.3 0.0 - 0.5 K/uL   Basophils Relative 0 %   Basophils Absolute 0.1 0.0 - 0.1 K/uL   Immature Granulocytes 0 %   Abs Immature Granulocytes 0.02 0.00 - 0.07 K/uL    Comment: Performed at [facility], [address], [place], [place] [address]  Comprehensive metabolic panel     Status: Abnormal   Collection Time: 09/10/19 12:32 AM  Result Value Ref Range   Sodium 138 135 - 145 mmol/L   Potassium 4.3 3.5 - 5.1 mmol/L   Chloride 108 98 - 111 mmol/L   CO2 20 (L) 22 - 32 mmol/L   Glucose, Bld 93 70 - 99 mg/dL   BUN 11 6 - 20 mg/dL   Creatinine, Ser 4.17 (H) 0.44 - 1.00 mg/dL   Calcium 9.6 8.9 - 40.8 mg/dL   Total Protein 7.2 6.5 - 8.1 g/dL   Albumin 3.9 3.5 - 5.0 g/dL   AST 55 (H) 15 - 41 U/L   ALT 52 (H) 0 - 44 U/L   Alkaline Phosphatase 63 38 - 126 U/L   Total Bilirubin <0.1 (L) 0.3 - 1.2 mg/dL   GFR calc non Af Amer >60 >60 mL/min   GFR calc Af  Amer >60 >60 mL/min   Anion gap 10 5 - 15    Comment: Performed at [facility], [address], [place], [place] [address]  hCG, quantitative, pregnancy     Status: Abnormal   Collection Time: 09/10/19 12:32 AM  Result Value Ref Range   hCG, Beta Chain, Quant, S 13,845 (H) <5 mIU/mL    Comment:          GEST. AGE      CONC.  (mIU/mL)   <=1 WEEK        5 - 50     2 WEEKS       50 - 500     3 WEEKS       100 - 10,000     4 WEEKS     1,000 - 30,000     5 WEEKS     3,500 - 115,000   6-8 WEEKS     12,000 - 270,000    12 WEEKS     15,000 - 220,000        FEMALE AND NON-PREGNANT FEMALE:     LESS THAN 5 mIU/mL Performed at [facility], [address], [place], [place] [address]   HIV Antibody (routine testing w rflx)     Status: None   Collection Time: 09/10/19 12:32 AM  Result Value Ref Range   HIV Screen 4th Generation wRfx NON REACTIVE NON REACTIVE    Comment: Performed at [facility], [address], [place], [place] [address]  ABO/Rh     Status: None   Collection Time: 09/10/19 12:39 AM  Result Value Ref Range   ABO/RH(D) AB POS    No rh immune globuloin      NOT A RH IMMUNE GLOBULIN CANDIDATE, PT RH POSITIVE Performed at [facility], [address], [place], [place] [address]  Wet prep, genital     Status: Abnormal   Collection Time: 09/10/19 12:41 AM  Result Value Ref Range   Yeast Wet Prep HPF POC NONE SEEN NONE SEEN   Trich, Wet Prep NONE SEEN NONE SEEN   Clue Cells Wet Prep HPF POC PRESENT (A) NONE SEEN   WBC, Wet Prep HPF POC MANY (A) NONE SEEN   Sperm NONE SEEN     Comment: Performed at [facility], [address], [place], [place] [address]  Urinalysis, Routine w reflex microscopic     Status: Abnormal   Collection Time: 09/10/19 12:41 AM  Result Value Ref Range   Color, Urine STRAW (A) YELLOW   APPearance CLEAR CLEAR   Specific Gravity, Urine 1.002 (L) 1.005 - 1.030   pH 7.0 5.0 - 8.0   Glucose, UA NEGATIVE NEGATIVE mg/dL    Hgb urine dipstick NEGATIVE NEGATIVE   Bilirubin Urine NEGATIVE NEGATIVE   Ketones, ur NEGATIVE NEGATIVE mg/dL   Protein, ur NEGATIVE NEGATIVE mg/dL   Nitrite NEGATIVE NEGATIVE   Leukocytes,Ua NEGATIVE NEGATIVE    Comment: Performed at [facility], [address], [place], [place] [address]   US OB LESS THAN 14 WEEKS WITH OB TRANSVAGINAL  Result Date: 09/10/2019 CLINICAL DATA:  Pregnant patient in first-trimester pregnancy with abdominal pain. EXAM: OBSTETRIC <14 WK [place] AND TRANSVAGINAL OB US TECHNIQUE: Both transabdominal and transvaginal ultrasound examinations were performed for complete evaluation of the gestation as well as the maternal uterus, adnexal regions, and pelvic cul-de-sac. Transvaginal technique was performed to assess early pregnancy. COMPARISON:  None. FINDINGS: Intrauterine gestational sac: Single Yolk sac:  Visualized. Embryo:  Not Visualized. Cardiac Activity: Not Visualized. MSD: 10.4 mm   5 w   5 d Subchorionic hemorrhage:  None visualized. Maternal uterus/adnexae: The right ovary appears normal. There is a 4.2 x 3.7 x 3.9 cm cyst in the left ovary. Blood flow is seen to the adjacent ovarian parenchyma. Small pelvic free fluid. No suspicious adnexal mass. IMPRESSION: 1. Probable early intrauterine gestational sac with a yolk sac, but no fetal pole or cardiac activity yet visualized. Recommend follow-up quantitative B-HCG levels and follow-up US in 10-14 days to assess viability. No subchorionic hemorrhage. 2. Left ovarian cyst measures 4.2 cm. Blood flow is noted to the ovarian parenchyma. Electronically Signed   By: Narda Rutherford M.D.   On: 09/10/2019 01:58    Review of Systems  Constitutional: Negative for fever.  Gastrointestinal: Positive for abdominal pain. Negative for nausea and vomiting.  Genitourinary: Negative for vaginal bleeding and vaginal discharge.  Psychiatric/Behavioral: Decreased concentration:    Physical Exam   Blood pressure 113/69, pulse 89,  temperature 98.3 F (36.8 C), temperature source Oral, resp. rate 18, height 5\' 7"  (1.702 m), weight 74.2 kg, last menstrual period 08/01/2019, SpO2 100 %.  Physical Exam  Constitutional: She is oriented to person, place, and time. She appears well-developed and well-nourished. No distress.  HENT:  Head: Normocephalic.  Eyes: Pupils are equal, round, and reactive to light.  GI: Normal appearance. There is abdominal tenderness in the suprapubic area and left lower quadrant. There is no rigidity, no rebound, no guarding and no CVA tenderness.  Genitourinary: There is no tenderness on the right labia. There is no tenderness on the left labia.    Genitourinary Comments: Wet prep and GC collected without speculum.    Musculoskeletal:        General: Normal range of motion.  Neurological: She is alert and  oriented to person, place, and time.  Skin: Skin is warm. She is not diaphoretic.  Psychiatric: Her behavior is normal.   MAU Course  Procedures  None  MDM  Wet prep & GC HIV, CBC, Hcg, ABO US OB transvaginal  Discussed results in detail with patient. She is asymptomatic for BV and will not treat at this time.   Assessment and Plan   A:  1. Abdominal pain during pregnancy, antepartum   2. Abdominal pain in pregnancy   3. Cyst of left ovary   4. [redacted] weeks gestation of pregnancy     P:  Discharge home in stable condition US shows IUP today OTC tylenol as directed on the bottle for abdominal pain.  Return to MAU if symptoms worsen Start prenatal care  Adelheid Hoggard, Harolyn Rutherford, NP 09/12/2019 7:07 AM

## 2019-09-10 NOTE — MAU Note (Signed)
Pt reports for the last 3 days she has had pain in LLQ pain. Reports the pain is only present when she lays on her left side. Pt reports that the pain is a "nagging pain". Pt had some light pink spotting yesterday. Pt denies itching. Pt thinks she has somewhat of an odor. LMP: 08/01/19.

## 2019-09-10 NOTE — Discharge Instructions (Signed)
Ovarian Cyst An ovarian cyst is a fluid-filled sac on an ovary. The ovaries are organs that make eggs in women. Most ovarian cysts go away on their own and are not cancerous (are benign). Some cysts need treatment. Follow these instructions at home:  Take over-the-counter and prescription medicines only as told by your doctor.  Do not drive or use heavy machinery while taking prescription pain medicine.  Get pelvic exams and Pap tests as often as told by your doctor.  Return to your normal activities as told by your doctor. Ask your doctor what activities are safe for you.  Do not use any products that contain nicotine or tobacco, such as cigarettes and e-cigarettes. If you need help quitting, ask your doctor.  Keep all follow-up visits as told by your doctor. This is important. Contact a doctor if:  Your periods are: ? Late. ? Irregular. ? Painful.   Your periods stop.  You have pelvic pain that does not go away.  You have pressure on your bladder.  You have trouble making your bladder empty when you pee (urinate).  You have pain during sex.  You have any of the following in your belly (abdomen): ? A feeling of fullness. ? Pressure. ? Discomfort. ? Pain that does not go away. ? Swelling.  You feel sick most of the time.  You have trouble pooping (have constipation).  You are not as hungry as usual (you lose your appetite).  You get very bad acne.  You start to have more hair on your body and face.  You are gaining weight or losing weight without changing your exercise and eating habits.  You think you may be pregnant. Get help right away if:  You have belly pain that is very bad or gets worse.  You cannot eat or drink without throwing up (vomiting).  You suddenly get a fever.  Your period is a lot heavier than usual. This information is not intended to replace advice given to you by your health care provider. Make sure you discuss any questions you have  with your health care provider. Document Revised: 07/16/2017 Document Reviewed: 01/05/2016 Elsevier Patient Education  2020 [employer] Inc[employer]  

## 2019-09-11 LAB — GC/CHLAMYDIA PROBE AMP (~~LOC~~) NOT AT ARMC
Chlamydia: NEGATIVE
Comment: NEGATIVE
Comment: NORMAL
Neisseria Gonorrhea: NEGATIVE

## 2019-09-27 ENCOUNTER — Telehealth: Payer: Self-pay | Admitting: Internal Medicine

## 2019-09-27 NOTE — Telephone Encounter (Signed)
Patient would also like to know can she eat salmon baked?

## 2019-09-27 NOTE — Telephone Encounter (Signed)
Pt cant get into obgyn until March the 5 they dont see ppl until 10 weeks pt is having migraine wants to know what to take. Pt stated on a scale from 1-10 they are a 9 last night she could breath had to sit up and hold a pillow to her chest and she took 2 tylenol 500 mg at 3:30 AM it made it easy to a 7 from the 9

## 2019-09-27 NOTE — Telephone Encounter (Signed)
If patient did not have significant improvement in headache with Tylenol, she can take a Maxzide tablet if needed. Would just advise that she try Tylenol as her first line treatment during pregnancy. It is also safe for her to repeat the Tylenol 500 mg two tablet dosing again at this point.   Marcy Siren, D.O. Primary Care at [facility]  09/27/2019, 9:49 AM

## 2019-09-27 NOTE — Telephone Encounter (Signed)
Call pt and informed her.

## 2019-09-27 NOTE — Telephone Encounter (Signed)
Salmon during pregnancy is perfectly safe to eat during pregnancy as long as it is cooked. I would just recommend limiting to 2-3 servings per week.   Marcy Siren, D.O. Primary Care at [facility]  09/27/2019, 1:24 PM

## 2019-10-13 ENCOUNTER — Ambulatory Visit (INDEPENDENT_AMBULATORY_CARE_PROVIDER_SITE_OTHER): Payer: Medicaid Other

## 2019-10-13 DIAGNOSIS — Z3480 Encounter for supervision of other normal pregnancy, unspecified trimester: Secondary | ICD-10-CM

## 2019-10-13 DIAGNOSIS — Z3481 Encounter for supervision of other normal pregnancy, first trimester: Secondary | ICD-10-CM

## 2019-10-13 DIAGNOSIS — Z3A10 10 weeks gestation of pregnancy: Secondary | ICD-10-CM

## 2019-10-13 MED ORDER — BLOOD PRESSURE KIT DEVI: 1.0000 | 0 refills | Status: DC

## 2019-10-13 NOTE — Progress Notes (Signed)
I connected with  Tanya Mccullough on 10/13/19 by a video enabled telemedicine application and verified that I am speaking with the correct person using two identifiers.   I discussed the limitations of evaluation and management by telemedicine. The patient expressed understanding and agreed to proceed.  PRENATAL INTAKE SUMMARY  [name] presents today New OB Nurse Interview.  OB History    Gravida  3   Para  2   Term  2   Preterm  0   AB  0   Living  2     SAB  0   TAB  0   Ectopic  0   Multiple  0   Live Births  2          I have reviewed the patient's medical, obstetrical, social, and family histories, medications, and available lab results.  SUBJECTIVE She has no unusual complaints  OBJECTIVE Initial Physical Exam (New OB)  GENERAL APPEARANCE: alert, well appearing, oriented to person, place and time   ASSESSMENT Normal pregnancy  PLAN Prenatal care @ [facility] & [employer] App. Keep NOB appt.

## 2019-10-13 NOTE — Progress Notes (Signed)
Patient seen and assessed by nursing staff during this encounter. I have reviewed the chart and agree with the documentation and plan.  Vonzella Nipple, PA-C 10/13/2019 9:16 AM

## 2019-10-20 ENCOUNTER — Ambulatory Visit (INDEPENDENT_AMBULATORY_CARE_PROVIDER_SITE_OTHER): Payer: Medicaid Other | Admitting: Obstetrics and Gynecology

## 2019-10-20 ENCOUNTER — Other Ambulatory Visit: Payer: Self-pay

## 2019-10-20 ENCOUNTER — Encounter: Payer: Self-pay | Admitting: Obstetrics and Gynecology

## 2019-10-20 ENCOUNTER — Other Ambulatory Visit (HOSPITAL_COMMUNITY)
Admission: RE | Admit: 2019-10-20 | Discharge: 2019-10-20 | Disposition: A | Discharge status: Home / Self Care | Payer: BLUE CROSS/BLUE SHIELD | Source: Ambulatory Visit | Attending: Obstetrics and Gynecology | Admitting: Obstetrics and Gynecology

## 2019-10-20 DIAGNOSIS — Z3480 Encounter for supervision of other normal pregnancy, unspecified trimester: Secondary | ICD-10-CM

## 2019-10-20 DIAGNOSIS — Z3481 Encounter for supervision of other normal pregnancy, first trimester: Secondary | ICD-10-CM | POA: Diagnosis not present

## 2019-10-20 DIAGNOSIS — Z3A11 11 weeks gestation of pregnancy: Secondary | ICD-10-CM | POA: Insufficient documentation

## 2019-10-20 DIAGNOSIS — Z1151 Encounter for screening for human papillomavirus (HPV): Secondary | ICD-10-CM | POA: Insufficient documentation

## 2019-10-20 MED ORDER — VITAFOL GUMMIES 3.33-0.333-34.8 MG PO CHEW: 2.0000 | CHEWABLE_TABLET | Freq: Every day | ORAL | 6 refills | Status: AC

## 2019-10-20 NOTE — Progress Notes (Signed)
NOB in office, denies any pain today. Intake interview done on 10-13-19

## 2019-10-20 NOTE — Progress Notes (Signed)
   Subjective:    Tanya Mccullough is a [id number] [redacted]w[redacted]d being seen today for her first obstetrical visit.  Her obstetrical history is significant for 2 previous normal pregnancies. Patient does intend to breast feed. Pregnancy history fully reviewed.  Patient reports no complaints.  Vitals:   10/20/19 0901  BP: 100/68  Pulse: 92  Weight: 165 lb 6.4 oz (75 kg)    HISTORY: OB History  Gravida Para Term Preterm AB Living  3 2 2  0 0 2  SAB TAB Ectopic Multiple Live Births  0 0 0 0 2    # Outcome Date GA Lbr Len/2nd Weight Sex Delivery Anes PTL Lv  3 Current           2 Term      Vag-Spont   LIV  1 Term      Vag-Spont   LIV   Past Medical History:  Diagnosis Date  . Migraines   . Vaginal Pap smear, abnormal    Past Surgical History:  Procedure Laterality Date  . CHOLECYSTECTOMY    . GALLBLADDER SURGERY    . TONSILECTOMY, ADENOIDECTOMY, BILATERAL MYRINGOTOMY AND TUBES    . TONSILLECTOMY     Family History  Problem Relation Age of Onset  . Healthy Mother   . Other Father        murdered  . Diabetes Maternal Grandmother   . Hypertension Maternal Grandmother   . Migraines Cousin   . ADD / ADHD Son   . Asthma Son   . Cancer Maternal Aunt   . Hypertension Maternal Aunt      Exam    Uterus:     Pelvic Exam:    Perineum: Normal Perineum   Vulva: normal   Vagina:  normal mucosa, normal discharge   pH:    Cervix: multiparous appearance and cervix is closed and long   Adnexa: normal adnexa and no mass, fullness, tenderness   Bony Pelvis: gynecoid  System: Breast:  normal appearance, no masses or tenderness   Skin: normal coloration and turgor, no rashes    Neurologic: oriented, no focal deficits   Extremities: normal strength, tone, and muscle mass   HEENT extra ocular movement intact   Mouth/Teeth mucous membranes moist, pharynx normal without lesions and dental hygiene good   Neck supple and no masses   Cardiovascular: regular rate and rhythm   Respiratory:   appears well, vitals normal, no respiratory distress, acyanotic, normal RR, chest clear, no wheezing, crepitations, rhonchi, normal symmetric air entry   Abdomen: soft, non-tender; bowel sounds normal; no masses,  no organomegaly   Urinary:       Assessment:    Pregnancy: Patient Active Problem List   Diagnosis Date Noted  . Supervision of other normal pregnancy, antepartum 10/13/2019        Plan:     Initial labs drawn. Prenatal vitamins. Problem list reviewed and updated. Genetic Screening discussed : Panorama ordered.  Ultrasound discussed; fetal survey: ordered. Patient interested in waterbirth  Follow up in 4 weeks. 50% of 30 min visit spent on counseling and coordination of care.     Jeannett Dekoning 10/20/2019

## 2019-10-20 NOTE — Patient Instructions (Addendum)
First Trimester of Pregnancy The first trimester of pregnancy is from week 1 until the end of week 13 (months 1 through 3). A week after a sperm fertilizes an egg, the egg will implant on the wall of the uterus. This embryo will begin to develop into a baby. Genes from you and your partner will form the baby. The female genes will determine whether the baby will be a boy or a girl. At 6-8 weeks, the eyes and face will be formed, and the heartbeat can be seen on ultrasound. At the end of 12 weeks, all the baby's organs will be formed. Now that you are pregnant, you will want to do everything you can to have a healthy baby. Two of the most important things are to get good prenatal care and to follow your health care provider's instructions. Prenatal care is all the medical care you receive before the baby's birth. This care will help prevent, find, and treat any problems during the pregnancy and childbirth. Body changes during your first trimester Your body goes through many changes during pregnancy. The changes vary from woman to woman.  You may gain or lose a couple of pounds at first.  You may feel sick to your stomach (nauseous) and you may throw up (vomit). If the vomiting is uncontrollable, call your health care provider.  You may tire easily.  You may develop headaches that can be relieved by medicines. All medicines should be approved by your health care provider.  You may urinate more often. Painful urination may mean you have a bladder infection.  You may develop heartburn as a result of your pregnancy.  You may develop constipation because certain hormones are causing the muscles that push stool through your intestines to slow down.  You may develop hemorrhoids or swollen veins (varicose veins).  Your breasts may begin to grow larger and become tender. Your nipples may stick out more, and the tissue that surrounds them (areola) may become darker.  Your gums may bleed and may be  sensitive to brushing and flossing.  Dark spots or blotches (chloasma, mask of pregnancy) may develop on your face. This will likely fade after the baby is born.  Your menstrual periods will stop.  You may have a loss of appetite.  You may develop cravings for certain kinds of food.  You may have changes in your emotions from day to day, such as being excited to be pregnant or being concerned that something may go wrong with the pregnancy and baby.  You may have more vivid and strange dreams.  You may have changes in your hair. These can include thickening of your hair, rapid growth, and changes in texture. Some women also have hair loss during or after pregnancy, or hair that feels dry or thin. Your hair will most likely return to normal after your baby is born. What to expect at prenatal visits During a routine prenatal visit:  You will be weighed to make sure you and the baby are growing normally.  Your blood pressure will be taken.  Your abdomen will be measured to track your baby's growth.  The fetal heartbeat will be listened to between weeks 10 and 14 of your pregnancy.  Test results from any previous visits will be discussed. Your health care provider may ask you:  How you are feeling.  If you are feeling the baby move.  If you have had any abnormal symptoms, such as leaking fluid, bleeding, severe headaches, or  abdominal cramping.  If you are using any tobacco products, including cigarettes, chewing tobacco, and electronic cigarettes.  If you have any questions. Other tests that may be performed during your first trimester include:  Blood tests to find your blood type and to check for the presence of any previous infections. The tests will also be used to check for low iron levels (anemia) and protein on red blood cells (Rh antibodies). Depending on your risk factors, or if you previously had diabetes during pregnancy, you may have tests to check for high blood sugar  that affects pregnant women (gestational diabetes).  Urine tests to check for infections, diabetes, or protein in the urine.  An ultrasound to confirm the proper growth and development of the baby.  Fetal screens for spinal cord problems (spina bifida) and Down syndrome.  HIV (human immunodeficiency virus) testing. Routine prenatal testing includes screening for HIV, unless you choose not to have this test.  You may need other tests to make sure you and the baby are doing well. Follow these instructions at home: Medicines  Follow your health care provider's instructions regarding medicine use. Specific medicines may be either safe or unsafe to take during pregnancy.  Take a prenatal vitamin that contains at least 600 micrograms (mcg) of folic acid.  If you develop constipation, try taking a stool softener if your health care provider approves. Eating and drinking   Eat a balanced diet that includes fresh fruits and vegetables, whole grains, good sources of protein such as meat, eggs, or tofu, and low-fat dairy. Your health care provider will help you determine the amount of weight gain that is right for you.  Avoid raw meat and uncooked cheese. These carry germs that can cause birth defects in the baby.  Eating four or five small meals rather than three large meals a day may help relieve nausea and vomiting. If you start to feel nauseous, eating a few soda crackers can be helpful. Drinking liquids between meals, instead of during meals, also seems to help ease nausea and vomiting.  Limit foods that are high in fat and processed sugars, such as fried and sweet foods.  To prevent constipation: ? Eat foods that are high in fiber, such as fresh fruits and vegetables, whole grains, and beans. ? Drink enough fluid to keep your urine clear or pale yellow. Activity  Exercise only as directed by your health care provider. Most women can continue their usual exercise routine during  pregnancy. Try to exercise for 30 minutes at least 5 days a week. Exercising will help you: ? Control your weight. ? Stay in shape. ? Be prepared for labor and delivery.  Experiencing pain or cramping in the lower abdomen or lower back is a good sign that you should stop exercising. Check with your health care provider before continuing with normal exercises.  Try to avoid standing for long periods of time. Move your legs often if you must stand in one place for a long time.  Avoid heavy lifting.  Wear low-heeled shoes and practice good posture.  You may continue to have sex unless your health care provider tells you not to. Relieving pain and discomfort  Wear a good support bra to relieve breast tenderness.  Take warm sitz baths to soothe any pain or discomfort caused by hemorrhoids. Use hemorrhoid cream if your health care provider approves.  Rest with your legs elevated if you have leg cramps or low back pain.  If you develop varicose veins  in your legs, wear support hose. Elevate your feet for 15 minutes, 3-4 times a day. Limit salt in your diet. Prenatal care  Schedule your prenatal visits by the twelfth week of pregnancy. They are usually scheduled monthly at first, then more often in the last 2 months before delivery.  Write down your questions. Take them to your prenatal visits.  Keep all your prenatal visits as told by your health care provider. This is important. Safety  Wear your seat belt at all times when driving.  Make a list of emergency phone numbers, including numbers for family, friends, the hospital, and police and fire departments. General instructions  Ask your health care provider for a referral to a local prenatal education class. Begin classes no later than the beginning of month 6 of your pregnancy.  Ask for help if you have counseling or nutritional needs during pregnancy. Your health care provider can offer advice or refer you to specialists for help  with various needs.  Do not use hot tubs, steam rooms, or saunas.  Do not douche or use tampons or scented sanitary pads.  Do not cross your legs for long periods of time.  Avoid cat litter boxes and soil used by cats. These carry germs that can cause birth defects in the baby and possibly loss of the fetus by miscarriage or stillbirth.  Avoid all smoking, herbs, alcohol, and medicines not prescribed by your health care provider. Chemicals in these products affect the formation and growth of the baby.  Do not use any products that contain nicotine or tobacco, such as cigarettes and e-cigarettes. If you need help quitting, ask your health care provider. You may receive counseling support and other resources to help you quit.  Schedule a dentist appointment. At home, brush your teeth with a soft toothbrush and be gentle when you floss. Contact a health care provider if:  You have dizziness.  You have mild pelvic cramps, pelvic pressure, or nagging pain in the abdominal area.  You have persistent nausea, vomiting, or diarrhea.  You have a bad smelling vaginal discharge.  You have pain when you urinate.  You notice increased swelling in your face, hands, legs, or ankles.  You are exposed to fifth disease or chickenpox.  You are exposed to [place] measles (rubella) and have never had it. Get help right away if:  You have a fever.  You are leaking fluid from your vagina.  You have spotting or bleeding from your vagina.  You have severe abdominal cramping or pain.  You have rapid weight gain or loss.  You vomit blood or material that looks like coffee grounds.  You develop a severe headache.  You have shortness of breath.  You have any kind of trauma, such as from a fall or a car accident. Summary  The first trimester of pregnancy is from week 1 until the end of week 13 (months 1 through 3).  Your body goes through many changes during pregnancy. The changes vary from  woman to woman.  You will have routine prenatal visits. During those visits, your health care provider will examine you, discuss any test results you may have, and talk with you about how you are feeling. This information is not intended to replace advice given to you by your health care provider. Make sure you discuss any questions you have with your health care provider. Document Revised: 07/16/2017 Document Reviewed: 07/15/2016 Elsevier Patient Education  [place] of  Pregnancy The second trimester is from week 14 through week 27 (months 4 through 6). The second trimester is often a time when you feel your best. Your body has adjusted to being pregnant, and you begin to feel better physically. Usually, morning sickness has lessened or quit completely, you may have more energy, and you may have an increase in appetite. The second trimester is also a time when the fetus is growing rapidly. At the end of the sixth month, the fetus is about 9 inches long and weighs about 1 pounds. You will likely begin to feel the baby move (quickening) between 16 and 20 weeks of pregnancy. Body changes during your second trimester Your body continues to go through many changes during your second trimester. The changes vary from woman to woman.  Your weight will continue to increase. You will notice your lower abdomen bulging out.  You may begin to get stretch marks on your hips, abdomen, and breasts.  You may develop headaches that can be relieved by medicines. The medicines should be approved by your health care provider.  You may urinate more often because the fetus is pressing on your bladder.  You may develop or continue to have heartburn as a result of your pregnancy.  You may develop constipation because certain hormones are causing the muscles that push waste through your intestines to slow down.  You may develop hemorrhoids or swollen, bulging veins (varicose  veins).  You may have back pain. This is caused by: ? Weight gain. ? Pregnancy hormones that are relaxing the joints in your pelvis. ? A shift in weight and the muscles that support your balance.  Your breasts will continue to grow and they will continue to become tender.  Your gums may bleed and may be sensitive to brushing and flossing.  Dark spots or blotches (chloasma, mask of pregnancy) may develop on your face. This will likely fade after the baby is born.  A dark line from your belly button to the pubic area (linea nigra) may appear. This will likely fade after the baby is born.  You may have changes in your hair. These can include thickening of your hair, rapid growth, and changes in texture. Some women also have hair loss during or after pregnancy, or hair that feels dry or thin. Your hair will most likely return to normal after your baby is born. What to expect at prenatal visits During a routine prenatal visit:  You will be weighed to make sure you and the fetus are growing normally.  Your blood pressure will be taken.  Your abdomen will be measured to track your baby's growth.  The fetal heartbeat will be listened to.  Any test results from the previous visit will be discussed. Your health care provider may ask you:  How you are feeling.  If you are feeling the baby move.  If you have had any abnormal symptoms, such as leaking fluid, bleeding, severe headaches, or abdominal cramping.  If you are using any tobacco products, including cigarettes, chewing tobacco, and electronic cigarettes.  If you have any questions. Other tests that may be performed during your second trimester include:  Blood tests that check for: ? Low iron levels (anemia). ? High blood sugar that affects pregnant women (gestational diabetes) between 81 and 28 weeks. ? Rh antibodies. This is to check for a protein on red blood cells (Rh factor).  Urine tests to check for infections,  diabetes, or protein in the urine.  An ultrasound to confirm the proper growth and development of the baby.  An amniocentesis to check for possible genetic problems.  Fetal screens for spina bifida and Down syndrome.  HIV (human immunodeficiency virus) testing. Routine prenatal testing includes screening for HIV, unless you choose not to have this test. Follow these instructions at home: Medicines  Follow your health care provider's instructions regarding medicine use. Specific medicines may be either safe or unsafe to take during pregnancy.  Take a prenatal vitamin that contains at least 600 micrograms (mcg) of folic acid.  If you develop constipation, try taking a stool softener if your health care provider approves. Eating and drinking   Eat a balanced diet that includes fresh fruits and vegetables, whole grains, good sources of protein such as meat, eggs, or tofu, and low-fat dairy. Your health care provider will help you determine the amount of weight gain that is right for you.  Avoid raw meat and uncooked cheese. These carry germs that can cause birth defects in the baby.  If you have low calcium intake from food, talk to your health care provider about whether you should take a daily calcium supplement.  Limit foods that are high in fat and processed sugars, such as fried and sweet foods.  To prevent constipation: ? Drink enough fluid to keep your urine clear or pale yellow. ? Eat foods that are high in fiber, such as fresh fruits and vegetables, whole grains, and beans. Activity  Exercise only as directed by your health care provider. Most women can continue their usual exercise routine during pregnancy. Try to exercise for 30 minutes at least 5 days a week. Stop exercising if you experience uterine contractions.  Avoid heavy lifting, wear low heel shoes, and practice good posture.  A sexual relationship may be continued unless your health care provider directs you  otherwise. Relieving pain and discomfort  Wear a good support bra to prevent discomfort from breast tenderness.  Take warm sitz baths to soothe any pain or discomfort caused by hemorrhoids. Use hemorrhoid cream if your health care provider approves.  Rest with your legs elevated if you have leg cramps or low back pain.  If you develop varicose veins, wear support hose. Elevate your feet for 15 minutes, 3-4 times a day. Limit salt in your diet. Prenatal Care  Write down your questions. Take them to your prenatal visits.  Keep all your prenatal visits as told by your health care provider. This is important. Safety  Wear your seat belt at all times when driving.  Make a list of emergency phone numbers, including numbers for family, friends, the hospital, and police and fire departments. General instructions  Ask your health care provider for a referral to a local prenatal education class. Begin classes no later than the beginning of month 6 of your pregnancy.  Ask for help if you have counseling or nutritional needs during pregnancy. Your health care provider can offer advice or refer you to specialists for help with various needs.  Do not use hot tubs, steam rooms, or saunas.  Do not douche or use tampons or scented sanitary pads.  Do not cross your legs for long periods of time.  Avoid cat litter boxes and soil used by cats. These carry germs that can cause birth defects in the baby and possibly loss of the fetus by miscarriage or stillbirth.  Avoid all smoking, herbs, alcohol, and unprescribed drugs. Chemicals in these products can affect the formation and growth of  the baby.  Do not use any products that contain nicotine or tobacco, such as cigarettes and e-cigarettes. If you need help quitting, ask your health care provider.  Visit your dentist if you have not gone yet during your pregnancy. Use a soft toothbrush to brush your teeth and be gentle when you floss. Contact a  health care provider if:  You have dizziness.  You have mild pelvic cramps, pelvic pressure, or nagging pain in the abdominal area.  You have persistent nausea, vomiting, or diarrhea.  You have a bad smelling vaginal discharge.  You have pain when you urinate. Get help right away if:  You have a fever.  You are leaking fluid from your vagina.  You have spotting or bleeding from your vagina.  You have severe abdominal cramping or pain.  You have rapid weight gain or weight loss.  You have shortness of breath with chest pain.  You notice sudden or extreme swelling of your face, hands, ankles, feet, or legs.  You have not felt your baby move in over an hour.  You have severe headaches that do not go away when you take medicine.  You have vision changes. Summary  The second trimester is from week 14 through week 27 (months 4 through 6). It is also a time when the fetus is growing rapidly.  Your body goes through many changes during pregnancy. The changes vary from woman to woman.  Avoid all smoking, herbs, alcohol, and unprescribed drugs. These chemicals affect the formation and growth your baby.  Do not use any tobacco products, such as cigarettes, chewing tobacco, and e-cigarettes. If you need help quitting, ask your health care provider.  Contact your health care provider if you have any questions. Keep all prenatal visits as told by your health care provider. This is important. This information is not intended to replace advice given to you by your health care provider. Make sure you discuss any questions you have with your health care provider. Document Revised: 11/25/2018 Document Reviewed: 09/08/2016 Elsevier Patient Education  2020 [employer].   Contraception Choices Contraception, also called birth control, refers to methods or devices that prevent pregnancy. Hormonal methods Contraceptive implant  A contraceptive implant is a thin, plastic tube that  contains a hormone. It is inserted into the upper part of the arm. It can remain in place for up to 3 years. Progestin-only injections Progestin-only injections are injections of progestin, a synthetic form of the hormone progesterone. They are given every 3 months by a health care provider. Birth control pills  Birth control pills are pills that contain hormones that prevent pregnancy. They must be taken once a day, preferably at the same time each day. Birth control patch  The birth control patch contains hormones that prevent pregnancy. It is placed on the skin and must be changed once a week for three weeks and removed on the fourth week. A prescription is needed to use this method of contraception. Vaginal ring  A vaginal ring contains hormones that prevent pregnancy. It is placed in the vagina for three weeks and removed on the fourth week. After that, the process is repeated with a new ring. A prescription is needed to use this method of contraception. Emergency contraceptive Emergency contraceptives prevent pregnancy after unprotected sex. They come in pill form and can be taken up to 5 days after sex. They work best the sooner they are taken after having sex. Most emergency contraceptives are available without a prescription. This  method should not be used as your only form of birth control. Barrier methods Female condom  A female condom is a thin sheath that is worn over the penis during sex. Condoms keep sperm from going inside a woman's body. They can be used with a spermicide to increase their effectiveness. They should be disposed after a single use. Female condom  A female condom is a soft, loose-fitting sheath that is put into the vagina before sex. The condom keeps sperm from going inside a woman's body. They should be disposed after a single use. Diaphragm  A diaphragm is a soft, dome-shaped barrier. It is inserted into the vagina before sex, along with a spermicide. The  diaphragm blocks sperm from entering the uterus, and the spermicide kills sperm. A diaphragm should be left in the vagina for 6-8 hours after sex and removed within 24 hours. A diaphragm is prescribed and fitted by a health care provider. A diaphragm should be replaced every 1-2 years, after giving birth, after gaining more than 15 lb (6.8 kg), and after pelvic surgery. Cervical cap  A cervical cap is a round, soft latex or plastic cup that fits over the cervix. It is inserted into the vagina before sex, along with spermicide. It blocks sperm from entering the uterus. The cap should be left in place for 6-8 hours after sex and removed within 48 hours. A cervical cap must be prescribed and fitted by a health care provider. It should be replaced every 2 years. Sponge  A sponge is a soft, circular piece of polyurethane foam with spermicide on it. The sponge helps block sperm from entering the uterus, and the spermicide kills sperm. To use it, you make it wet and then insert it into the vagina. It should be inserted before sex, left in for at least 6 hours after sex, and removed and thrown away within 30 hours. Spermicides Spermicides are chemicals that kill or block sperm from entering the cervix and uterus. They can come as a cream, jelly, suppository, foam, or tablet. A spermicide should be inserted into the vagina with an applicator at least 29-79 minutes before sex to allow time for it to work. The process must be repeated every time you have sex. Spermicides do not require a prescription. Intrauterine contraception Intrauterine device (IUD) An IUD is a T-shaped device that is put in a woman's uterus. There are two types:  Hormone IUD.This type contains progestin, a synthetic form of the hormone progesterone. This type can stay in place for 3-5 years.  Copper IUD.This type is wrapped in copper wire. It can stay in place for 10 years.  Permanent methods of contraception Female tubal ligation In  this method, a woman's fallopian tubes are sealed, tied, or blocked during surgery to prevent eggs from traveling to the uterus. Hysteroscopic sterilization In this method, a small, flexible insert is placed into each fallopian tube. The inserts cause scar tissue to form in the fallopian tubes and block them, so sperm cannot reach an egg. The procedure takes about 3 months to be effective. Another form of birth control must be used during those 3 months. Female sterilization This is a procedure to tie off the tubes that carry sperm (vasectomy). After the procedure, the man can still ejaculate fluid (semen). Natural planning methods Natural family planning In this method, a couple does not have sex on days when the woman could become pregnant. Calendar method This means keeping track of the length of each menstrual cycle,  identifying the days when pregnancy can happen, and not having sex on those days. Ovulation method In this method, a couple avoids sex during ovulation. Symptothermal method This method involves not having sex during ovulation. The woman typically checks for ovulation by watching changes in her temperature and in the consistency of cervical mucus. Post-ovulation method In this method, a couple waits to have sex until after ovulation. Summary  Contraception, also called birth control, means methods or devices that prevent pregnancy.  Hormonal methods of contraception include implants, injections, pills, patches, vaginal rings, and emergency contraceptives.  Barrier methods of contraception can include female condoms, female condoms, diaphragms, cervical caps, sponges, and spermicides.  There are two types of IUDs (intrauterine devices). An IUD can be put in a woman's uterus to prevent pregnancy for 3-5 years.  Permanent sterilization can be done through a procedure for males, females, or both.  Natural family planning methods involve not having sex on days when the woman could  become pregnant. This information is not intended to replace advice given to you by your health care provider. Make sure you discuss any questions you have with your health care provider. Document Revised: 08/05/2017 Document Reviewed: 09/05/2016 Elsevier Patient Education  2020 [employer].   Breastfeeding  Choosing to breastfeed is one of the best decisions you can make for yourself and your baby. A change in hormones during pregnancy causes your breasts to make breast milk in your milk-producing glands. Hormones prevent breast milk from being released before your baby is born. They also prompt milk flow after birth. Once breastfeeding has begun, thoughts of your baby, as well as his or her sucking or crying, can stimulate the release of milk from your milk-producing glands. Benefits of breastfeeding Research shows that breastfeeding offers many health benefits for infants and mothers. It also offers a cost-free and convenient way to feed your baby. For your baby  Your first milk (colostrum) helps your baby's digestive system to function better.  Special cells in your milk (antibodies) help your baby to fight off infections.  Breastfed babies are less likely to develop asthma, allergies, obesity, or type 2 diabetes. They are also at lower risk for sudden infant death syndrome (SIDS).  Nutrients in breast milk are better able to meet your baby's needs compared to infant formula.  Breast milk improves your baby's brain development. For you  Breastfeeding helps to create a very special bond between you and your baby.  Breastfeeding is convenient. Breast milk costs nothing and is always available at the correct temperature.  Breastfeeding helps to burn calories. It helps you to lose the weight that you gained during pregnancy.  Breastfeeding makes your uterus return faster to its size before pregnancy. It also slows bleeding (lochia) after you give birth.  Breastfeeding helps to lower  your risk of developing type 2 diabetes, osteoporosis, rheumatoid arthritis, cardiovascular disease, and breast, ovarian, uterine, and endometrial cancer later in life. Breastfeeding basics Starting breastfeeding  Find a comfortable place to sit or lie down, with your neck and back well-supported.  Place a pillow or a rolled-up blanket under your baby to bring him or her to the level of your breast (if you are seated). Nursing pillows are specially designed to help support your arms and your baby while you breastfeed.  Make sure that your baby's tummy (abdomen) is facing your abdomen.  Gently massage your breast. With your fingertips, massage from the outer edges of your breast inward toward the nipple. This encourages  milk flow. If your milk flows slowly, you may need to continue this action during the feeding.  Support your breast with 4 fingers underneath and your thumb above your nipple (make the letter "C" with your hand). Make sure your fingers are well away from your nipple and your baby's mouth.  Stroke your baby's lips gently with your finger or nipple.  When your baby's mouth is open wide enough, quickly bring your baby to your breast, placing your entire nipple and as much of the areola as possible into your baby's mouth. The areola is the colored area around your nipple. ? More areola should be visible above your baby's upper lip than below the lower lip. ? Your baby's lips should be opened and extended outward (flanged) to ensure an adequate, comfortable latch. ? Your baby's tongue should be between his or her lower gum and your breast.  Make sure that your baby's mouth is correctly positioned around your nipple (latched). Your baby's lips should create a seal on your breast and be turned out (everted).  It is common for your baby to suck about 2-3 minutes in order to start the flow of breast milk. Latching Teaching your baby how to latch onto your breast properly is very  important. An improper latch can cause nipple pain, decreased milk supply, and poor weight gain in your baby. Also, if your baby is not latched onto your nipple properly, he or she may swallow some air during feeding. This can make your baby fussy. Burping your baby when you switch breasts during the feeding can help to get rid of the air. However, teaching your baby to latch on properly is still the best way to prevent fussiness from swallowing air while breastfeeding. Signs that your baby has successfully latched onto your nipple  Silent tugging or silent sucking, without causing you pain. Infant's lips should be extended outward (flanged).  Swallowing heard between every 3-4 sucks once your milk has started to flow (after your let-down milk reflex occurs).  Muscle movement above and in front of his or her ears while sucking. Signs that your baby has not successfully latched onto your nipple  Sucking sounds or smacking sounds from your baby while breastfeeding.  Nipple pain. If you think your baby has not latched on correctly, slip your finger into the corner of your baby's mouth to break the suction and place it between your baby's gums. Attempt to start breastfeeding again. Signs of successful breastfeeding Signs from your baby  Your baby will gradually decrease the number of sucks or will completely stop sucking.  Your baby will fall asleep.  Your baby's body will relax.  Your baby will retain a small amount of milk in his or her mouth.  Your baby will let go of your breast by himself or herself. Signs from you  Breasts that have increased in firmness, weight, and size 1-3 hours after feeding.  Breasts that are softer immediately after breastfeeding.  Increased milk volume, as well as a change in milk consistency and color by the fifth day of breastfeeding.  Nipples that are not sore, cracked, or bleeding. Signs that your baby is getting enough milk  Wetting at least 1-2  diapers during the first 24 hours after birth.  Wetting at least 5-6 diapers every 24 hours for the first week after birth. The urine should be clear or pale yellow by the age of 5 days.  Wetting 6-8 diapers every 24 hours as your baby continues  to grow and develop.  At least 3 stools in a 24-hour period by the age of 5 days. The stool should be soft and yellow.  At least 3 stools in a 24-hour period by the age of 7 days. The stool should be seedy and yellow.  No loss of weight greater than 10% of birth weight during the first 3 days of life.  Average weight gain of 4-7 oz (113-198 g) per week after the age of 4 days.  Consistent daily weight gain by the age of 5 days, without weight loss after the age of 2 weeks. After a feeding, your baby may spit up a small amount of milk. This is normal. Breastfeeding frequency and duration Frequent feeding will help you make more milk and can prevent sore nipples and extremely full breasts (breast engorgement). Breastfeed when you feel the need to reduce the fullness of your breasts or when your baby shows signs of hunger. This is called "breastfeeding on demand." Signs that your baby is hungry include:  Increased alertness, activity, or restlessness.  Movement of the head from side to side.  Opening of the mouth when the corner of the mouth or cheek is stroked (rooting).  Increased sucking sounds, smacking lips, cooing, sighing, or squeaking.  Hand-to-mouth movements and sucking on fingers or hands.  Fussing or crying. Avoid introducing a pacifier to your baby in the first 4-6 weeks after your baby is born. After this time, you may choose to use a pacifier. Research has shown that pacifier use during the first year of a baby's life decreases the risk of sudden infant death syndrome (SIDS). Allow your baby to feed on each breast as long as he or she wants. When your baby unlatches or falls asleep while feeding from the first breast, offer the  second breast. Because newborns are often sleepy in the first few weeks of life, you may need to awaken your baby to get him or her to feed. Breastfeeding times will vary from baby to baby. However, the following rules can serve as a guide to help you make sure that your baby is properly fed:  Newborns (babies 14 weeks of age or younger) may breastfeed every 1-3 hours.  Newborns should not go without breastfeeding for longer than 3 hours during the day or 5 hours during the night.  You should breastfeed your baby a minimum of 8 times in a 24-hour period. Breast milk pumping     Pumping and storing breast milk allows you to make sure that your baby is exclusively fed your breast milk, even at times when you are unable to breastfeed. This is especially important if you go back to work while you are still breastfeeding, or if you are not able to be present during feedings. Your lactation consultant can help you find a method of pumping that works best for you and give you guidelines about how long it is safe to store breast milk. Caring for your breasts while you breastfeed Nipples can become dry, cracked, and sore while breastfeeding. The following recommendations can help keep your breasts moisturized and healthy:  Avoid using soap on your nipples.  Wear a supportive bra designed especially for nursing. Avoid wearing underwire-style bras or extremely tight bras (sports bras).  Air-dry your nipples for 3-4 minutes after each feeding.  Use only cotton bra pads to absorb leaked breast milk. Leaking of breast milk between feedings is normal.  Use lanolin on your nipples after breastfeeding. Lanolin helps  to maintain your skin's normal moisture barrier. Pure lanolin is not harmful (not toxic) to your baby. You may also hand express a few drops of breast milk and gently massage that milk into your nipples and allow the milk to air-dry. In the first few weeks after giving birth, some women experience  breast engorgement. Engorgement can make your breasts feel heavy, warm, and tender to the touch. Engorgement peaks within 3-5 days after you give birth. The following recommendations can help to ease engorgement:  Completely empty your breasts while breastfeeding or pumping. You may want to start by applying warm, moist heat (in the shower or with warm, water-soaked hand towels) just before feeding or pumping. This increases circulation and helps the milk flow. If your baby does not completely empty your breasts while breastfeeding, pump any extra milk after he or she is finished.  Apply ice packs to your breasts immediately after breastfeeding or pumping, unless this is too uncomfortable for you. To do this: ? Put ice in a plastic bag. ? Place a towel between your skin and the bag. ? Leave the ice on for 20 minutes, 2-3 times a day.  Make sure that your baby is latched on and positioned properly while breastfeeding. If engorgement persists after 48 hours of following these recommendations, contact your health care provider or a Science writer. Overall health care recommendations while breastfeeding  Eat 3 healthy meals and 3 snacks every day. Well-nourished mothers who are breastfeeding need an additional 450-500 calories a day. You can meet this requirement by increasing the amount of a balanced diet that you eat.  Drink enough water to keep your urine pale yellow or clear.  Rest often, relax, and continue to take your prenatal vitamins to prevent fatigue, stress, and low vitamin and mineral levels in your body (nutrient deficiencies).  Do not use any products that contain nicotine or tobacco, such as cigarettes and e-cigarettes. Your baby may be harmed by chemicals from cigarettes that pass into breast milk and exposure to secondhand smoke. If you need help quitting, ask your health care provider.  Avoid alcohol.  Do not use illegal drugs or marijuana.  Talk with your health care  provider before taking any medicines. These include over-the-counter and prescription medicines as well as vitamins and herbal supplements. Some medicines that may be harmful to your baby can pass through breast milk.  It is possible to become pregnant while breastfeeding. If birth control is desired, ask your health care provider about options that will be safe while breastfeeding your baby. Where to find more information: [employer]: www.llli.org Contact a health care provider if:  You feel like you want to stop breastfeeding or have become frustrated with breastfeeding.  Your nipples are cracked or bleeding.  Your breasts are red, tender, or warm.  You have: ? Painful breasts or nipples. ? A swollen area on either breast. ? A fever or chills. ? Nausea or vomiting. ? Drainage other than breast milk from your nipples.  Your breasts do not become full before feedings by the fifth day after you give birth.  You feel sad and depressed.  Your baby is: ? Too sleepy to eat well. ? Having trouble sleeping. ? More than 35 week old and wetting fewer than 6 diapers in a 24-hour period. ? Not gaining weight by 52 days of age.  Your baby has fewer than 3 stools in a 24-hour period.  Your baby's skin or the white parts of  his or her eyes become yellow. Get help right away if:  Your baby is overly tired (lethargic) and does not want to wake up and feed.  Your baby develops an unexplained fever. Summary  Breastfeeding offers many health benefits for infant and mothers.  Try to breastfeed your infant when he or she shows early signs of hunger.  Gently tickle or stroke your baby's lips with your finger or nipple to allow the baby to open his or her mouth. Bring the baby to your breast. Make sure that much of the areola is in your baby's mouth. Offer one side and burp the baby before you offer the other side.  Talk with your health care provider or lactation consultant  if you have questions or you face problems as you breastfeed. This information is not intended to replace advice given to you by your health care provider. Make sure you discuss any questions you have with your health care provider. Document Revised: 10/28/2017 Document Reviewed: 09/04/2016 Elsevier Patient Education  [place]? Guide for patients at [facility] for [employer]  Why consider waterbirth?  . Gentle birth for babies . Less pain medicine used in labor . May allow for passive descent/less pushing . May reduce perineal tears  . More mobility and instinctive maternal position changes . Increased maternal relaxation . Reduced blood pressure in labor  Is waterbirth safe? What are the risks of infection, drowning or other complications?  . Infection: o Very low risk (3.7 % for tub vs 4.8% for bed) o 7 in 8000 waterbirths with documented infection o Poorly cleaned equipment most common cause o Slightly lower group B strep transmission rate  . Drowning o Maternal:  - Very low risk   - Related to seizures or fainting o Newborn:  - Very low risk. No evidence of increased risk of respiratory problems in multiple large studies - Physiological protection from breathing under water - Avoid underwater birth if there are any fetal complications - Once baby's head is out of the water, keep it out.  . Birth complication o Some reports of cord trauma, but risk decreased by bringing baby to surface gradually o No evidence of increased risk of shoulder dystocia. Mothers can usually change positions faster in water than in a bed, possibly aiding the maneuvers to free the shoulder.   You must attend a Doren Custard class at [facility]  3rd Wednesday of every month from 7-9pm  [employer] by calling [phone] or online at [email or web address]  Bring [place] the certificate from the class to your prenatal appointment  Meet with a  midwife at 36 weeks to see if you can still plan a waterbirth and to sign the consent.   If you plan a waterbirth at [facility] and [facility], you will need to purchase the following:  Fish net  Bathing suit top (optional)  Long-handled mirror (optional)  Places to purchase or rent supplies:   GotWebTools.is for tub purchases and supplies  Waterbirthsolutions.com for tub purchases and supplies  [employer] (www.thelaborladies.com) $275 for tub rental/set-up & take down/kit   [employer] (http://www.fleming.com/.htm) Information regarding doulas (labor support) who provide pool rentals  Things that would prevent you from having a waterbirth:  Premature, <37wks  Previous cesarean birth  Presence of thick meconium-stained fluid  Multiple gestation (Twins, triplets, etc.)  Uncontrolled diabetes or gestational diabetes requiring medication  Hypertension requiring medication or diagnosis of pre-eclampsia  Heavy  vaginal bleeding  Non-reassuring fetal heart rate  Active infection (MRSA, etc.). Group B Strep is NOT a contraindication for waterbirth.  If your labor has to be induced and induction method requires continuous monitoring of the baby's heart rate  Other risks/issues identified by your obstetrical provider  Please remember that birth is unpredictable. Under certain unforeseeable circumstances your provider may advise against giving birth in the tub. These decisions will be made on a case-by-case basis and with the safety of you and your baby as our highest priority.

## 2019-10-21 LAB — OBSTETRIC PANEL, INCLUDING HIV
Antibody Screen: NEGATIVE
Basophils Absolute: 0.1 10*3/uL (ref 0.0–0.2)
Basos: 1 %
EOS (ABSOLUTE): 0.2 10*3/uL (ref 0.0–0.4)
Eos: 2 %
HIV Screen 4th Generation wRfx: NONREACTIVE
Hematocrit: 40.8 % (ref 34.0–46.6)
Hemoglobin: 14 g/dL (ref 11.1–15.9)
Hepatitis B Surface Ag: NEGATIVE
Immature Grans (Abs): 0 10*3/uL (ref 0.0–0.1)
Immature Granulocytes: 0 %
Lymphocytes Absolute: 1.8 10*3/uL (ref 0.7–3.1)
Lymphs: 16 %
MCH: 28.5 pg (ref 26.6–33.0)
MCHC: 34.3 g/dL (ref 31.5–35.7)
MCV: 83 fL (ref 79–97)
Monocytes Absolute: 0.7 10*3/uL (ref 0.1–0.9)
Monocytes: 6 %
Neutrophils Absolute: 8.9 10*3/uL — ABNORMAL HIGH (ref 1.4–7.0)
Neutrophils: 75 %
Platelets: 258 10*3/uL (ref 150–450)
RBC: 4.92 x10E6/uL (ref 3.77–5.28)
RDW: 13.4 % (ref 11.7–15.4)
RPR Ser Ql: NONREACTIVE
Rh Factor: POSITIVE
Rubella Antibodies, IGG: 8.81 index (ref >0.99)
WBC: 11.7 10*3/uL — ABNORMAL HIGH (ref 3.4–10.8)

## 2019-10-21 LAB — HEPATITIS C ANTIBODY: Hep C Virus Ab: <0.1 s/co ratio (ref 0.0–0.9)

## 2019-10-21 LAB — HEMOGLOBIN A1C
Est. average glucose Bld gHb Est-mCnc: 103 mg/dL
Hgb A1c MFr Bld: 5.2 % (ref 4.8–5.6)

## 2019-10-22 LAB — CULTURE, OB URINE

## 2019-10-22 LAB — URINE CULTURE, OB REFLEX: Organism ID, Bacteria: NO GROWTH

## 2019-10-23 LAB — CERVICOVAGINAL ANCILLARY ONLY
Bacterial Vaginitis (gardnerella): POSITIVE — AB
Candida Glabrata: NEGATIVE
Candida Vaginitis: NEGATIVE
Chlamydia: NEGATIVE
Comment: NEGATIVE
Comment: NEGATIVE
Comment: NEGATIVE
Comment: NEGATIVE
Comment: NEGATIVE
Comment: NORMAL
Neisseria Gonorrhea: NEGATIVE
Trichomonas: NEGATIVE

## 2019-10-23 MED ORDER — METRONIDAZOLE 500 MG PO TABS: 500.0000 mg | ORAL_TABLET | Freq: Two times a day (BID) | ORAL | 0 refills | Status: DC

## 2019-10-23 NOTE — Addendum Note (Signed)
Addended by: Catalina Antigua on: 10/23/2019 04:05 PM   Modules accepted: Orders

## 2019-10-26 ENCOUNTER — Encounter: Payer: Self-pay | Admitting: Obstetrics and Gynecology

## 2019-10-26 ENCOUNTER — Telehealth: Payer: Self-pay

## 2019-10-26 NOTE — Telephone Encounter (Signed)
Returned call and answered questions concerning what to eat in pregnancy

## 2019-10-30 LAB — CYTOLOGY - PAP
Comment: NEGATIVE
Comment: NEGATIVE
Diagnosis: NEGATIVE
Diagnosis: REACTIVE
HPV 16: NEGATIVE
HPV 18 / 45: POSITIVE — AB
High risk HPV: POSITIVE — AB

## 2019-10-31 ENCOUNTER — Telehealth: Payer: Self-pay

## 2019-10-31 NOTE — Telephone Encounter (Signed)
Return call to pt regarding back pain of lower left side Pt works from home Pt advised on comfort measures from Triage Protocol etc Pt also advised can take Tylenol Reg or Extra Strength Pt advised if no relief with comfort measures to contact the office.  Pt voiced understanding.

## 2019-11-03 ENCOUNTER — Telehealth: Payer: Self-pay

## 2019-11-03 NOTE — Telephone Encounter (Signed)
TC from pt requesting Rx for PNV Vitafol was not covered by Ins.  Please advise .

## 2019-11-04 ENCOUNTER — Other Ambulatory Visit: Payer: Self-pay | Admitting: Obstetrics

## 2019-11-04 NOTE — Telephone Encounter (Signed)
Dr> Constant has already Rx PNV's

## 2019-11-06 ENCOUNTER — Telehealth: Payer: Self-pay

## 2019-11-06 MED ORDER — PRENATAL GUMMIES/DHA & FA 0.4-32.5 MG PO CHEW: 2.0000 | CHEWABLE_TABLET | Freq: Every day | ORAL | 12 refills | Status: DC

## 2019-11-06 NOTE — Telephone Encounter (Signed)
Pt called stating PNV's that were sent are not being covered can you prescribe a different one?

## 2019-11-06 NOTE — Addendum Note (Signed)
Addended by: Catalina Antigua on: 11/06/2019 10:09 AM   Modules accepted: Orders

## 2019-11-16 ENCOUNTER — Encounter: Payer: Self-pay | Admitting: Obstetrics

## 2019-11-16 ENCOUNTER — Telehealth (INDEPENDENT_AMBULATORY_CARE_PROVIDER_SITE_OTHER): Payer: Medicaid Other | Admitting: Obstetrics

## 2019-11-16 DIAGNOSIS — Z3480 Encounter for supervision of other normal pregnancy, unspecified trimester: Secondary | ICD-10-CM

## 2019-11-16 DIAGNOSIS — Z3481 Encounter for supervision of other normal pregnancy, first trimester: Secondary | ICD-10-CM

## 2019-11-16 DIAGNOSIS — Z3A15 15 weeks gestation of pregnancy: Secondary | ICD-10-CM

## 2019-11-16 NOTE — Progress Notes (Signed)
   OBSTETRICS PRENATAL VIRTUAL VISIT ENCOUNTER NOTE  Provider location: [facility] for [facility]   I connected with Tanya Mccullough on 11/16/19 at  9:15 AM EDT by MyChart Video Encounter at home and verified that I am speaking with the correct person using two identifiers.   I discussed the limitations, risks, security and privacy concerns of performing an evaluation and management service virtually and the availability of in person appointments. I also discussed with the patient that there may be a patient responsible charge related to this service. The patient expressed understanding and agreed to proceed. Subjective:  Tanya Mccullough is a 29 y.o. G3P2002 at [redacted]w[redacted]d being seen today for ongoing prenatal care.  She is currently monitored for the following issues for this low-risk pregnancy and has Supervision of other normal pregnancy, antepartum on their problem list.  Patient reports no complaints.  Contractions: Not present. Vag. Bleeding: None.  Movement: Present. Denies any leaking of fluid.   The following portions of the patient's history were reviewed and updated as appropriate: allergies, current medications, past family history, past medical history, past social history, past surgical history and problem list.   Objective:   Vitals:   11/16/19 0932  BP: 119/68  Pulse: 81    Fetal Status:     Movement: Present     General:  Alert, oriented and cooperative. Patient is in no acute distress.  Respiratory: Normal respiratory effort, no problems with respiration noted  Mental Status: Normal mood and affect. Normal behavior. Normal judgment and thought content.  Rest of physical exam deferred due to type of encounter  Imaging: No results found.  Assessment and Plan:  Pregnancy: G3P2002 at [redacted]w[redacted]d 1. Supervision of other normal pregnancy, antepartum   Preterm labor symptoms and general obstetric precautions including but not limited to vaginal bleeding,  contractions, leaking of fluid and fetal movement were reviewed in detail with the patient. I discussed the assessment and treatment plan with the patient. The patient was provided an opportunity to ask questions and all were answered. The patient agreed with the plan and demonstrated an understanding of the instructions. The patient was advised to call back or seek an in-person office evaluation/go to [facility] at [facility] for any urgent or concerning symptoms. Please refer to After Visit Summary for other counseling recommendations.   I provided 10 minutes of face-to-face time during this encounter.  Return in about 4 weeks (around 12/14/2019) for MyChart.  Future Appointments  Date Time Provider Department Center  12/12/2019  9:45 AM WH-MFC [place] 2 WH-MFCUS MFC-US  12/12/2019 10:50 AM WOC-WOCA LAB WOC-WOCA WOC  04/23/2020  9:30 AM [name], NP GNA-GNA None    Coral Ceo, MD Center for [facility], [facility] 11/16/2019

## 2019-11-16 NOTE — Progress Notes (Signed)
Patient presents for Mychart visit ROB. Patient identified with 2 patient identifiers. BP is 119/68.

## 2019-12-07 ENCOUNTER — Telehealth: Payer: Self-pay

## 2019-12-07 NOTE — Telephone Encounter (Signed)
Return call to [facility] pt regarding plan travel  Pt advised no air travel after 28wks I consulted with Dr.Constant to confirm.  Pt voiced understanding.

## 2019-12-12 ENCOUNTER — Other Ambulatory Visit: Payer: Self-pay

## 2019-12-12 ENCOUNTER — Other Ambulatory Visit: Payer: BLUE CROSS/BLUE SHIELD

## 2019-12-12 ENCOUNTER — Other Ambulatory Visit (HOSPITAL_COMMUNITY): Payer: Self-pay | Admitting: *Deleted

## 2019-12-12 ENCOUNTER — Encounter: Payer: Medicaid Other | Admitting: Obstetrics

## 2019-12-12 ENCOUNTER — Encounter: Payer: Self-pay | Admitting: Obstetrics and Gynecology

## 2019-12-12 ENCOUNTER — Ambulatory Visit (INDEPENDENT_AMBULATORY_CARE_PROVIDER_SITE_OTHER): Payer: BLUE CROSS/BLUE SHIELD | Admitting: Obstetrics and Gynecology

## 2019-12-12 ENCOUNTER — Other Ambulatory Visit (HOSPITAL_COMMUNITY)
Admission: RE | Admit: 2019-12-12 | Discharge: 2019-12-12 | Disposition: A | Discharge status: Home / Self Care | Payer: BLUE CROSS/BLUE SHIELD | Source: Ambulatory Visit | Attending: Obstetrics and Gynecology | Admitting: Obstetrics and Gynecology

## 2019-12-12 ENCOUNTER — Ambulatory Visit (HOSPITAL_COMMUNITY)
Admission: RE | Admit: 2019-12-12 | Discharge: 2019-12-12 | Disposition: A | Discharge status: Home / Self Care | Payer: BLUE CROSS/BLUE SHIELD | Source: Ambulatory Visit | Attending: Obstetrics and Gynecology | Admitting: Obstetrics and Gynecology

## 2019-12-12 VITALS — BP 106/71 | HR 85 | Wt 168.0 lb

## 2019-12-12 DIAGNOSIS — Z3480 Encounter for supervision of other normal pregnancy, unspecified trimester: Secondary | ICD-10-CM

## 2019-12-12 DIAGNOSIS — Z3A19 19 weeks gestation of pregnancy: Secondary | ICD-10-CM

## 2019-12-12 DIAGNOSIS — Z363 Encounter for antenatal screening for malformations: Secondary | ICD-10-CM | POA: Insufficient documentation

## 2019-12-12 DIAGNOSIS — Z3482 Encounter for supervision of other normal pregnancy, second trimester: Secondary | ICD-10-CM | POA: Diagnosis not present

## 2019-12-12 DIAGNOSIS — Z362 Encounter for other antenatal screening follow-up: Secondary | ICD-10-CM

## 2019-12-12 MED ORDER — BUTALBITAL-APAP-CAFFEINE 50-325-40 MG PO CAPS: 1.0000 | ORAL_CAPSULE | Freq: Four times a day (QID) | ORAL | 3 refills | Status: DC | PRN

## 2019-12-12 NOTE — Addendum Note (Signed)
Addended by: Catalina Antigua on: 12/12/2019 08:31 AM   Modules accepted: Orders

## 2019-12-12 NOTE — Progress Notes (Signed)
   PRENATAL VISIT NOTE  Subjective:  Tanya Mccullough is a 29 y.o. G3P2002 at [redacted]w[redacted]d being seen today for ongoing prenatal care.  She is currently monitored for the following issues for this low-risk pregnancy and has Supervision of other normal pregnancy, antepartum on their problem list.  Patient reports no complaints.  Contractions: Not present. Vag. Bleeding: None.  Movement: Present. Denies leaking of fluid.   The following portions of the patient's history were reviewed and updated as appropriate: allergies, current medications, past family history, past medical history, past social history, past surgical history and problem list.   Objective:   Vitals:   12/12/19 0813  BP: 106/71  Pulse: 85  Weight: 168 lb (76.2 kg)    Fetal Status: Fetal Heart Rate (bpm): 150   Movement: Present     General:  Alert, oriented and cooperative. Patient is in no acute distress.  Skin: Skin is warm and dry. No rash noted.   Cardiovascular: Normal heart rate noted  Respiratory: Normal respiratory effort, no problems with respiration noted  Abdomen: Soft, gravid, appropriate for gestational age.  Pain/Pressure: Absent     Pelvic: Cervical exam deferred        Extremities: Normal range of motion.  Edema: None  Mental Status: Normal mood and affect. Normal behavior. Normal judgment and thought content.   Assessment and Plan:  Pregnancy: G3P2002 at [redacted]w[redacted]d 1. Supervision of other normal pregnancy, antepartum Patient is doing well without complaints She reports the presence of a discharge with odor- vaginal swab collected Anatomy ultrasound and AFP today  Preterm labor symptoms and general obstetric precautions including but not limited to vaginal bleeding, contractions, leaking of fluid and fetal movement were reviewed in detail with the patient. Please refer to After Visit Summary for other counseling recommendations.   Return in about 4 weeks (around 01/09/2020) for ROB, Low risk,  Virtual.  Future Appointments  Date Time Provider Department Center  12/12/2019  9:45 AM WH-MFC [place] 2 WH-MFCUS MFC-US  12/12/2019 10:50 AM [facility]OC-[facility] LAB [facility] WOC  04/23/2020  9:30 AM Lomax, Amy, NP [facility] None    Catalina Antigua, MD

## 2019-12-13 LAB — CERVICOVAGINAL ANCILLARY ONLY
Bacterial Vaginitis (gardnerella): POSITIVE — AB
Candida Glabrata: NEGATIVE
Candida Vaginitis: NEGATIVE
Chlamydia: NEGATIVE
Comment: NEGATIVE
Comment: NEGATIVE
Comment: NEGATIVE
Comment: NEGATIVE
Comment: NEGATIVE
Comment: NORMAL
Neisseria Gonorrhea: NEGATIVE
Trichomonas: NEGATIVE

## 2019-12-13 MED ORDER — METRONIDAZOLE 500 MG PO TABS: 500.0000 mg | ORAL_TABLET | Freq: Two times a day (BID) | ORAL | 0 refills | Status: DC

## 2019-12-13 NOTE — Addendum Note (Signed)
Addended by: Catalina Antigua on: 12/13/2019 04:08 PM   Modules accepted: Orders

## 2019-12-14 ENCOUNTER — Encounter: Payer: Self-pay | Admitting: Obstetrics and Gynecology

## 2019-12-14 ENCOUNTER — Telehealth: Payer: Medicaid Other | Admitting: Obstetrics

## 2019-12-14 LAB — AFP, SERUM, OPEN SPINA BIFIDA
AFP MoM: 0.98
AFP Value: 51.3 ng/mL
Gest. Age on Collection Date: 18.9 weeks
Maternal Age At EDD: 30.7 yr
OSBR Risk 1 IN: 10000
Test Results:: NEGATIVE
Weight: 165 [lb_av]

## 2019-12-25 ENCOUNTER — Encounter (HOSPITAL_COMMUNITY): Payer: Self-pay | Admitting: Obstetrics & Gynecology

## 2019-12-25 ENCOUNTER — Telehealth: Payer: Self-pay

## 2019-12-25 ENCOUNTER — Ambulatory Visit (INDEPENDENT_AMBULATORY_CARE_PROVIDER_SITE_OTHER): Payer: BLUE CROSS/BLUE SHIELD

## 2019-12-25 ENCOUNTER — Inpatient Hospital Stay (HOSPITAL_COMMUNITY)
Admission: AD | Admit: 2019-12-25 | Discharge: 2019-12-25 | Disposition: A | Discharge status: Home / Self Care | Payer: BLUE CROSS/BLUE SHIELD | Attending: Obstetrics & Gynecology | Admitting: Obstetrics & Gynecology

## 2019-12-25 ENCOUNTER — Other Ambulatory Visit: Payer: Self-pay

## 2019-12-25 DIAGNOSIS — M549 Dorsalgia, unspecified: Secondary | ICD-10-CM | POA: Insufficient documentation

## 2019-12-25 DIAGNOSIS — R300 Dysuria: Secondary | ICD-10-CM

## 2019-12-25 DIAGNOSIS — Z881 Allergy status to other antibiotic agents status: Secondary | ICD-10-CM | POA: Insufficient documentation

## 2019-12-25 DIAGNOSIS — Z3A20 20 weeks gestation of pregnancy: Secondary | ICD-10-CM | POA: Diagnosis not present

## 2019-12-25 DIAGNOSIS — R350 Frequency of micturition: Secondary | ICD-10-CM

## 2019-12-25 DIAGNOSIS — O26892 Other specified pregnancy related conditions, second trimester: Secondary | ICD-10-CM | POA: Diagnosis not present

## 2019-12-25 DIAGNOSIS — O99891 Other specified diseases and conditions complicating pregnancy: Secondary | ICD-10-CM

## 2019-12-25 DIAGNOSIS — O99892 Other specified diseases and conditions complicating childbirth: Secondary | ICD-10-CM

## 2019-12-25 DIAGNOSIS — Z3480 Encounter for supervision of other normal pregnancy, unspecified trimester: Secondary | ICD-10-CM

## 2019-12-25 LAB — POCT URINALYSIS DIPSTICK
Bilirubin, UA: NEGATIVE
Glucose, UA: NEGATIVE
Ketones, UA: NEGATIVE
Leukocytes, UA: NEGATIVE
Nitrite, UA: NEGATIVE
Protein, UA: NEGATIVE
Spec Grav, UA: 1.02 (ref 1.010–1.025)
Urobilinogen, UA: 0.2 E.U./dL
pH, UA: 6.5 (ref 5.0–8.0)

## 2019-12-25 LAB — URINALYSIS, ROUTINE W REFLEX MICROSCOPIC
Bilirubin Urine: NEGATIVE
Glucose, UA: NEGATIVE mg/dL
Hgb urine dipstick: NEGATIVE
Ketones, ur: NEGATIVE mg/dL
Nitrite: NEGATIVE
Protein, ur: NEGATIVE mg/dL
Specific Gravity, Urine: 1.005 (ref 1.005–1.030)
pH: 6 (ref 5.0–8.0)

## 2019-12-25 MED ORDER — COMFORT FIT MATERNITY SUPP MED MISC: 1.0000 | Freq: Every day | 0 refills | Status: DC

## 2019-12-25 NOTE — Discharge Instructions (Signed)
Back Pain in Pregnancy Back pain during pregnancy is common. Back pain may be caused by several factors that are related to changes during your pregnancy. Follow these instructions at home: Managing pain, stiffness, and swelling      If directed, for sudden (acute) back pain, put ice on the painful area. ? Put ice in a plastic bag. ? Place a towel between your skin and the bag. ? Leave the ice on for 20 minutes, 2-3 times per day.  If directed, apply heat to the affected area before you exercise. Use the heat source that your health care provider recommends, such as a moist heat pack or a heating pad. ? Place a towel between your skin and the heat source. ? Leave the heat on for 20-30 minutes. ? Remove the heat if your skin turns bright red. This is especially important if you are unable to feel pain, heat, or cold. You may have a greater risk of getting burned.  If directed, massage the affected area. Activity  Exercise as told by your health care provider. Gentle exercise is the best way to prevent or manage back pain.  Listen to your body when lifting. If lifting hurts, ask for help or bend your knees. This uses your leg muscles instead of your back muscles.  Squat down when picking up something from the floor. Do not bend over.  Only use bed rest for short periods as told by your health care provider. Bed rest should only be used for the most severe episodes of back pain. Standing, sitting, and lying down  Do not stand in one place for long periods of time.  Use good posture when sitting. Make sure your head rests over your shoulders and is not hanging forward. Use a pillow on your lower back if necessary.  Try sleeping on your side, preferably the left side, with a pregnancy support pillow or 1-2 regular pillows between your legs. ? If you have back pain after a night's rest, your bed may be too soft. ? A firm mattress may provide more support for your back during  pregnancy. General instructions  Do not wear high heels.  Eat a healthy diet. Try to gain weight within your health care provider's recommendations.  Use a maternity girdle, elastic sling, or back brace as told by your health care provider.  Take over-the-counter and prescription medicines only as told by your health care provider.  Work with a physical therapist or massage therapist to find ways to manage back pain. Acupuncture or massage therapy may be helpful.  Keep all follow-up visits as told by your health care provider. This is important. Contact a health care provider if:  Your back pain interferes with your daily activities.  You have increasing pain in other parts of your body. Get help right away if:  You develop numbness, tingling, weakness, or problems with the use of your arms or legs.  You develop severe back pain that is not controlled with medicine.  You have a change in bowel or bladder control.  You develop shortness of breath, dizziness, or you faint.  You develop nausea, vomiting, or sweating.  You have back pain that is a rhythmic, cramping pain similar to labor pains. Labor pain is usually 1-2 minutes apart, lasts for about 1 minute, and involves a bearing down feeling or pressure in your pelvis.  You have back pain and your water breaks or you have vaginal bleeding.  You have back pain or numbness  that travels down your leg.  Your back pain developed after you fell.  You develop pain on one side of your back.  You see blood in your urine.  You develop skin blisters in the area of your back pain. Summary  Back pain may be caused by several factors that are related to changes during your pregnancy.  Follow instructions as told by your health care provider for managing pain, stiffness, and swelling.  Exercise as told by your health care provider. Gentle exercise is the best way to prevent or manage back pain.  Take over-the-counter and  prescription medicines only as told by your health care provider.  Keep all follow-up visits as told by your health care provider. This is important. This information is not intended to replace advice given to you by your health care provider. Make sure you discuss any questions you have with your health care provider. Document Revised: 11/22/2018 Document Reviewed: 01/19/2018 Elsevier Patient Education  2020 [employer]    PREGNANCY SUPPORT BELT: You are not alone, Seventy-five percent of women have some sort of abdominal or back pain at some point in their pregnancy. Your baby is growing at a fast pace, which means that your whole body is rapidly trying to adjust to the changes. As your uterus grows, your back may start feeling a bit under stress and this can result in back or abdominal pain that can go from mild, and therefore bearable, to severe pains that will not allow you to sit or lay down comfortably, When it comes to dealing with pregnancy-related pains and cramps, some pregnant women usually prefer natural remedies, which the market is filled with nowadays. For example, wearing a pregnancy support belt can help ease and lessen your discomfort and pain. WHAT ARE THE BENEFITS OF WEARING A PREGNANCY SUPPORT BELT? A pregnancy support belt provides support to the lower portion of the belly taking some of the weight of the growing uterus and distributing to the other parts of your body. It is designed make you comfortable and gives you extra support. Over the years, the pregnancy apparel market has been studying the needs and wants of pregnant women and they have come up with the most comfortable pregnancy support belts that woman could ever ask for. In fact, you will no longer have to wear a stretched-out or bulky pregnancy belt that is visible underneath your clothes and makes you feel even more uncomfortable. Nowadays, a pregnancy support belt is made of comfortable and stretchy materials that  will not irritate your skin but will actually make you feel at ease and you will not even notice you are wearing it. They are easy to put on and adjust during the day and can be worn at night for additional support.  BENEFITS: . Relives Back pain . Relieves Abdominal Muscle and Leg Pain . Stabilizes the Pelvic Ring . Offers a Cushioned Abdominal Lift Pad . Relieves pressure on the Sciatic Nerve Within Minutes WHERE TO GET YOUR PREGNANCY BELT: [employer] [facility] [employer] [phone] @[address] [place], [place] [address]    

## 2019-12-25 NOTE — Progress Notes (Signed)
Tanya Mccullough is here to leave a urine sample.  She reports having dysuria, urgency, and frequency for the past month or so.  Urinalysis came back with only large blood.  Per Dr. Jolayne Panther send the urine for culture before sending medication to the pharmacy.  Pt agreed.  Results pending. -EH/RMA

## 2019-12-25 NOTE — MAU Note (Signed)
Blood in urine at office, culture was sent but no antibiotic yet and lower back pain x 4 days and right side low pelvic pain and vaginal pain sharp that started today.  No vaginal bleeding. Baby moving well. No leaking. No pain with urination.

## 2019-12-25 NOTE — Telephone Encounter (Signed)
Pt called and reports that she is having lower back pain. Pt denies any dysuria but said sometimes she gets a shooting pain in her vaginal area. Pt denies any cramping or bleeding. I advised pt to come in for testing to rule out UTI. Pt voices understanding and is scheduled to come in today.

## 2019-12-25 NOTE — MAU Provider Note (Addendum)
History     CSN: [id number]  Arrival date and time: 12/25/19 2022   First Provider Initiated Contact with Patient 12/25/19 2133      Chief Complaint  Patient presents with  . Back Pain  . Abdominal Pain   [name] is a 29 y.o. G3P2002 at 39w6dwho presents to MAU for back pain, sharp vaginal pain. Pt denies dysuria. Pt denies bleeding with urination.  Onset: 4 days Location: low back, vaginal Duration: 4 days Character: dull, aching, intermittent Aggravating/Associated: none/urinary frequency, urgency Relieving: none Treatment: water - did not help Severity: 0/10  Pt denies VB, LOF, ctx, decreased FM, vaginal discharge/odor/itching. Pt denies N/V, abdominal pain, constipation, diarrhea. Pt denies fever, chills, fatigue, sweating or changes in appetite. Pt denies SOB or chest pain. Pt denies dizziness, HA, light-headedness, weakness.  Problems this pregnancy include: none. Allergies? compazine Current medications/supplements? PNVs, Flagyl (BV) Prenatal care provider? [facility], next appt 01/09/2020   OB History    Gravida  3   Para  2   Term  2   Preterm  0   AB  0   Living  2     SAB  0   TAB  0   Ectopic  0   Multiple  0   Live Births  2           Past Medical History:  Diagnosis Date  . Migraines   . Vaginal Pap smear, abnormal     Past Surgical History:  Procedure Laterality Date  . CHOLECYSTECTOMY    . GALLBLADDER SURGERY    . TONSILECTOMY, ADENOIDECTOMY, BILATERAL MYRINGOTOMY AND TUBES    . TONSILLECTOMY      Family History  Problem Relation Age of Onset  . Healthy Mother   . Other Father        murdered  . Diabetes Maternal Grandmother   . Hypertension Maternal Grandmother   . Migraines Cousin   . ADD / ADHD Son   . Asthma Son   . Cancer Maternal Aunt   . Hypertension Maternal Aunt     Social History   Tobacco Use  . Smoking status: Never Smoker  . Smokeless tobacco: Never Used  Substance Use Topics  .  Alcohol use: Not Currently    Comment: 1 glass every days  . Drug use: Never    Allergies:  Allergies  Allergen Reactions  . Prochlorperazine Other (See Comments)    Lip twitching    Medications Prior to Admission  Medication Sig Dispense Refill Last Dose  . Blood Pressure Monitoring (BLOOD PRESSURE KIT) DEVI 1 kit by Does not apply route once a week. Check Blood Pressure regularly and record readings into the [employer] App.  Large Cuff.  DX O90.0 1 each 0 12/24/2019 at Unknown time  . metroNIDAZOLE (FLAGYL) 500 MG tablet Take 1 tablet (500 mg total) by mouth 2 (two) times daily. 14 tablet 0 12/25/2019 at Unknown time  . Prenatal MV-Min-FA-Omega-3 (PRENATAL GUMMIES/DHA & FA) 0.4-32.5 MG CHEW Chew by mouth.   12/25/2019 at Unknown time  . Butalbital-APAP-Caffeine 50-325-40 MG capsule Take 1-2 capsules by mouth every 6 (six) hours as needed for headache. 30 capsule 3   . Prenatal MV-Min-FA-Omega-3 (PRENATAL GUMMIES/DHA & FA) 0.4-32.5 MG CHEW Chew 2 tablets by mouth daily. (Patient not taking: Reported on 12/12/2019) 60 tablet 12     Review of Systems  Constitutional: Negative for chills, diaphoresis, fatigue and fever.  Eyes: Negative for visual disturbance.  Respiratory: Negative for shortness  of breath.   Cardiovascular: Negative for chest pain.  Gastrointestinal: Negative for abdominal pain, constipation, diarrhea, nausea and vomiting.  Genitourinary: Positive for vaginal pain. Negative for dysuria, flank pain, frequency, pelvic pain, urgency, vaginal bleeding and vaginal discharge.  Musculoskeletal: Positive for back pain.  Neurological: Negative for dizziness, weakness, light-headedness and headaches.   Physical Exam   Blood pressure 105/65, pulse 86, temperature 98.8 F (37.1 C), temperature source Oral, resp. rate 18, height _0  (1.702 m), weight 77.6 kg, last menstrual period 08/01/2019, SpO2 100 %.  Patient Vitals for the past 24 hrs:  BP Temp Temp src Pulse Resp SpO2  Height Weight  12/25/19 2047 105/65 98.8 F (37.1 C) Oral 86 18 100 % -- --  12/25/19 2045 -- -- -- -- -- -- _1  (1.702 m) 77.6 kg   Physical Exam  Constitutional: She is oriented to person, place, and time. She appears well-developed and well-nourished. No distress (appears comfortable).  HENT:  Head: Normocephalic and atraumatic.  Cardiovascular: Normal rate.  Respiratory: Effort normal. No respiratory distress.  GI: Soft. She exhibits no distension. There is no abdominal tenderness. There is no CVA tenderness.  Musculoskeletal:     Cervical back: Normal.     Thoracic back: Normal.     Lumbar back: Normal.  Neurological: She is alert and oriented to person, place, and time.  Skin: Skin is warm and dry. She is not diaphoretic.  Psychiatric: She has a normal mood and affect. Her behavior is normal. Judgment and thought content normal.  FHT 150  Results for orders placed or performed during the hospital encounter of 12/25/19 (from the past 24 hour(s))  Urinalysis, Routine w reflex microscopic     Status: Abnormal   Collection Time: 12/25/19  8:50 PM  Result Value Ref Range   Color, Urine YELLOW YELLOW   APPearance HAZY (A) CLEAR   Specific Gravity, Urine 1.005 1.005 - 1.030   pH 6.0 5.0 - 8.0   Glucose, UA NEGATIVE NEGATIVE mg/dL   Hgb urine dipstick NEGATIVE NEGATIVE   Bilirubin Urine NEGATIVE NEGATIVE   Ketones, ur NEGATIVE NEGATIVE mg/dL   Protein, ur NEGATIVE NEGATIVE mg/dL   Nitrite NEGATIVE NEGATIVE   Leukocytes,Ua MODERATE (A) NEGATIVE   RBC / HPF 0-5 0 - 5 RBC/hpf   WBC, UA 0-5 0 - 5 WBC/hpf   Bacteria, UA FEW (A) NONE SEEN   Squamous Epithelial / LPF 11-20 0 - 5   Mucus PRESENT    MAU Course  Procedures  MDM -care transferred to [facility] _2  Nugent, Gerrie Nordmann, NP  9:56 PM 12/25/2019  No evidence of UTI, pyelo, pr PTL. Suspect pain is MSK, recommend heating pad, Tylenol, and maternity belt for comfort. Stable for discharge home. UC pending.    Assessment and Plan   1. [redacted] weeks gestation of pregnancy   2. Supervision of other normal pregnancy, antepartum   3. Back pain affecting pregnancy in second trimester   4. Urinary frequency    Discharge home Follow up at [facility] as scheduled PTL precautions Rx maternity belt  Allergies as of 12/25/2019      Reactions   Prochlorperazine Other (See Comments)   Lip twitching      Medication List    TAKE these medications   Blood Pressure Kit Devi 1 kit by Does not apply route once a week. Check Blood Pressure regularly and record readings into the Bab[employer] App.  Large Cuff.  DX O90.0   Butalbital-APAP-Caffeine 50-325-40 MG capsule  Take 1-2 capsules by mouth every 6 (six) hours as needed for headache.   Comfort Fit Maternity Supp Med Misc 1 Device by Does not apply route daily.   metroNIDAZOLE 500 MG tablet Commonly known as: Flagyl Take 1 tablet (500 mg total) by mouth 2 (two) times daily.   Prenatal Gummies/DHA & FA 0.4-32.5 MG Chew Chew by mouth. What changed: Another medication with the same name was removed. Continue taking this medication, and follow the directions you see here.      Julianne Handler, CNM  12/25/2019 11:12 PM

## 2019-12-25 NOTE — Progress Notes (Signed)
Patient was assessed and managed by nursing staff during this encounter. I have reviewed the chart and agree with the documentation and plan. I have also made any necessary editorial changes.  Catalina Antigua, MD 12/25/2019 3:31 PM

## 2019-12-26 DIAGNOSIS — O339 Maternal care for disproportion, unspecified: Secondary | ICD-10-CM | POA: Diagnosis not present

## 2019-12-26 LAB — CULTURE, OB URINE: Culture: NO GROWTH

## 2019-12-27 DIAGNOSIS — Z3201 Encounter for pregnancy test, result positive: Secondary | ICD-10-CM

## 2019-12-28 ENCOUNTER — Other Ambulatory Visit: Payer: Self-pay | Admitting: Internal Medicine

## 2019-12-28 DIAGNOSIS — Z3A21 21 weeks gestation of pregnancy: Secondary | ICD-10-CM

## 2019-12-28 LAB — URINE CULTURE

## 2019-12-29 MED ORDER — CEPHALEXIN 500 MG PO CAPS: 500.0000 mg | ORAL_CAPSULE | Freq: Four times a day (QID) | ORAL | 2 refills | Status: DC

## 2019-12-29 NOTE — Addendum Note (Signed)
Addended by: Catalina Antigua on: 12/29/2019 10:58 AM   Modules accepted: Orders

## 2020-01-09 ENCOUNTER — Other Ambulatory Visit: Payer: Self-pay | Admitting: *Deleted

## 2020-01-09 ENCOUNTER — Ambulatory Visit (HOSPITAL_COMMUNITY): Payer: BLUE CROSS/BLUE SHIELD | Attending: Obstetrics and Gynecology

## 2020-01-09 ENCOUNTER — Telehealth (INDEPENDENT_AMBULATORY_CARE_PROVIDER_SITE_OTHER): Payer: BLUE CROSS/BLUE SHIELD | Admitting: Advanced Practice Midwife

## 2020-01-09 ENCOUNTER — Other Ambulatory Visit: Payer: Self-pay

## 2020-01-09 DIAGNOSIS — Z362 Encounter for other antenatal screening follow-up: Secondary | ICD-10-CM | POA: Diagnosis not present

## 2020-01-09 DIAGNOSIS — Z3A23 23 weeks gestation of pregnancy: Secondary | ICD-10-CM | POA: Diagnosis not present

## 2020-01-09 DIAGNOSIS — Z3482 Encounter for supervision of other normal pregnancy, second trimester: Secondary | ICD-10-CM

## 2020-01-09 DIAGNOSIS — Z3480 Encounter for supervision of other normal pregnancy, unspecified trimester: Secondary | ICD-10-CM

## 2020-01-09 NOTE — Progress Notes (Signed)
OBSTETRICS PRENATAL VIRTUAL VISIT ENCOUNTER NOTE  Provider location: [facility] [place]   I connected with Tanya Mccullough on 01/09/20 at  8:35 AM EDT by MyChart Video Encounter at home and verified that I am speaking with the correct person using two identifiers.   I discussed the limitations, risks, security and privacy concerns of performing an evaluation and management service virtually and the availability of in person appointments. I also discussed with the patient that there may be a patient responsible charge related to this service. The patient expressed understanding and agreed to proceed. Subjective:  Tanya Mccullough is a 29 y.o. G3P2002 at [redacted]w[redacted]d being seen today for ongoing prenatal care.  She is currently monitored for the following issues for this low-risk pregnancy and has Supervision of other normal pregnancy, antepartum on their problem list.  Patient reports no complaints.  Contractions: Not present. Vag. Bleeding: None.  Movement: Present. Denies any leaking of fluid.   The following portions of the patient's history were reviewed and updated as appropriate: allergies, current medications, past family history, past medical history, past social history, past surgical history and problem list.   Objective:  There were no vitals filed for this visit.  Fetal Status:     Movement: Present     General:  Alert, oriented and cooperative. Patient is in no acute distress.  Respiratory: Normal respiratory effort, no problems with respiration noted  Mental Status: Normal mood and affect. Normal behavior. Normal judgment and thought content.  Rest of physical exam deferred due to type of encounter  Imaging: [place] MFM OB COMP + 14 WK  Result Date: 12/12/2019 ----------------------------------------------------------------------  OBSTETRICS REPORT                       (Signed Final 12/12/2019 10:35 am)  ---------------------------------------------------------------------- Patient Info  ID #:       [id number]                          D.O.B.:  1990-12-18 (29 yrs)  Name:       Tanya Mccullough                 Visit Date: 12/12/2019 09:44 am ---------------------------------------------------------------------- Performed By  Performed By:     Valda Favia          Ref. Address:     [address]  [place]                                                             [address]  Attending:        Noralee Space MD        [facility]  Referred By:      [facility] ---------------------------------------------------------------------- Orders   #  Description                          Code         Ordered By   1  [place] MFM OB COMP + 14 WK               76805.01     PEGGY CONSTANT  ----------------------------------------------------------------------   #  Order #                    Accession #                 Episode #   1  [id number]                  [id number]                  [id number]  ---------------------------------------------------------------------- Indications   [redacted] weeks gestation of pregnancy                Z3A.19   Encounter for antenatal screening for          Z36.3   malformations   Low risk NIPS, 6.8FF  ---------------------------------------------------------------------- Fetal Evaluation  Num Of Fetuses:         1  Cardiac Activity:       Observed  Presentation:           Cephalic  Placenta:               Posterior  P. Cord Insertion:      Visualized, central  Amniotic Fluid  AFI FV:      Within normal limits                              Largest Pocket(cm)                              4.06  ---------------------------------------------------------------------- Biometry  BPD:      41.4  mm     G. Age:  18w 4d         31  %    CI:        77.47   %    70 - 86  FL/HC:      18.7   %    16.1 - 18.3  HC:      148.9  mm     G. Age:  18w 0d          6  %    HC/AC:      1.10        1.09 - 1.39  AC:      134.8  mm     G. Age:  19w 0d         44  %    FL/BPD:     67.1   %  FL:       27.8  mm     G. Age:  18w 4d         26  %    FL/AC:      20.6   %    20 - 24  HUM:      26.6  mm     G. Age:  18w 3d         34  %  CER:      19.3  mm     G. Age:  18w 5d         40  %  NFT:       2.8  mm  CM:        4.9  mm  Est. FW:     251  gm      0 lb 9 oz     27  % ---------------------------------------------------------------------- OB History  Gravidity:    3         Term:   2        Prem:   0        SAB:   0  TOP:          0       Ectopic:  0        Living: 2 ---------------------------------------------------------------------- Gestational Age  LMP:           19w 0d        Date:  08/01/19                 EDD:   05/07/20  U/S Today:     18w 4d                                        EDD:   05/10/20  Best:          19w 0d     Det. By:  LMP  (08/01/19)          EDD:   05/07/20 ---------------------------------------------------------------------- Anatomy  Cranium:               Appears normal         LVOT:                   Appears normal  Cavum:                 Appears normal         Aortic Arch:            Not well visualized  Ventricles:            Appears normal         Ductal Arch:  Not well visualized  Choroid Plexus:        Appears normal         Diaphragm:              Appears normal  Cerebellum:            Appears normal         Stomach:                Appears normal, left                                                                        sided  Posterior Fossa:       Appears normal         Abdomen:                Appears normal  Nuchal Fold:            Appears normal         Abdominal Wall:         Appears nml (cord                                                                        insert, abd wall)  Face:                  Appears normal         Cord Vessels:           Appears normal (3                         (orbits and profile)                           vessel cord)  Lips:                  Appears normal         Kidneys:                Not well visualized  Palate:                Not well visualized    Bladder:                Appears normal  Thoracic:              Appears normal         Spine:                  Not well visualized  Heart:                 Not well visualized    Upper Extremities:      Appears normal  RVOT:                  Appears normal         Lower Extremities:  Appears normal  Other:  Fetus appears to be a female. Heels visualized. Lt hand/5th visualized. ---------------------------------------------------------------------- Cervix Uterus Adnexa  Cervix  Length:            3.9  cm.  Normal appearance by transabdominal scan.  Uterus  No abnormality visualized.  Left Ovary  No adnexal mass visualized.  Right Ovary  No adnexal mass visualized.  Cul De Sac  No free fluid seen.  Adnexa  No abnormality visualized. ---------------------------------------------------------------------- Impression  We performed fetal anatomy scan. No makers of  aneuploidies or fetal structural defects are seen. Fetal  biometry is consistent with her previously-established dates.  Amniotic fluid is normal and good fetal activity is seen.  Patient understands the limitations of ultrasound in detecting  fetal anomalies.  On cell-free fetal DNA screening, the risks of fetal  aneuploidies are not increased.  Obstetric history is significant for 2 previous term vaginal  deliveries. Patient reports no chronic medical conditions. ---------------------------------------------------------------------- Recommendations  -An appointment was made for her to return in 4 weeks  for  completion of fetal anatomy. ----------------------------------------------------------------------                  Noralee Space, MD Electronically Signed Final Report   12/12/2019 10:35 am ----------------------------------------------------------------------   Assessment and Plan:  Pregnancy: [id number] at [redacted]w[redacted]d 1. Supervision of other normal pregnancy, antepartum --Pt reports good fetal movement, denies cramping, LOF, or vaginal bleeding --Anticipatory guidance about next visits/weeks of pregnancy given. --F/U to anatomy US scheduled today, reviewed [place] on 12/12/19, all normal but incomplete --Next visit at [facility] in 4 weeks, in office for GTT  Preterm labor symptoms and general obstetric precautions including but not limited to vaginal bleeding, contractions, leaking of fluid and fetal movement were reviewed in detail with the patient. I discussed the assessment and treatment plan with the patient. The patient was provided an opportunity to ask questions and all were answered. The patient agreed with the plan and demonstrated an understanding of the instructions. The patient was advised to call back or seek an in-person office evaluation/go to MAU at [facility] for any urgent or concerning symptoms. Please refer to After Visit Summary for other counseling recommendations.   I provided 7 minutes of face-to-face time during this encounter.  Return in about 4 weeks (around 02/06/2020).  Future Appointments  Date Time Provider Department Center  01/09/2020 10:00 AM [facility] US1 [facility]  04/23/2020  9:30 AM Lomax, Amy, NP [facility] None    Sharen Counter, CNM [facility] [employer], [facility]

## 2020-01-09 NOTE — Progress Notes (Signed)
Virtual ROB   CC:    

## 2020-01-19 ENCOUNTER — Encounter: Payer: Self-pay | Admitting: Advanced Practice Midwife

## 2020-02-06 ENCOUNTER — Ambulatory Visit: Payer: BLUE CROSS/BLUE SHIELD | Attending: Obstetrics and Gynecology

## 2020-02-06 ENCOUNTER — Other Ambulatory Visit: Payer: Self-pay

## 2020-02-06 DIAGNOSIS — Z3A27 27 weeks gestation of pregnancy: Secondary | ICD-10-CM

## 2020-02-06 DIAGNOSIS — Z362 Encounter for other antenatal screening follow-up: Secondary | ICD-10-CM | POA: Diagnosis not present

## 2020-02-07 ENCOUNTER — Encounter: Payer: Self-pay | Admitting: General Practice

## 2020-02-07 ENCOUNTER — Other Ambulatory Visit: Payer: Self-pay

## 2020-02-07 ENCOUNTER — Ambulatory Visit (INDEPENDENT_AMBULATORY_CARE_PROVIDER_SITE_OTHER): Payer: BLUE CROSS/BLUE SHIELD | Admitting: Advanced Practice Midwife

## 2020-02-07 ENCOUNTER — Encounter: Payer: Self-pay | Admitting: Advanced Practice Midwife

## 2020-02-07 VITALS — BP 115/76 | HR 72 | Wt 175.2 lb

## 2020-02-07 DIAGNOSIS — Z3402 Encounter for supervision of normal first pregnancy, second trimester: Secondary | ICD-10-CM

## 2020-02-07 DIAGNOSIS — Z3480 Encounter for supervision of other normal pregnancy, unspecified trimester: Secondary | ICD-10-CM

## 2020-02-07 DIAGNOSIS — Z3A27 27 weeks gestation of pregnancy: Secondary | ICD-10-CM

## 2020-02-07 DIAGNOSIS — Z23 Encounter for immunization: Secondary | ICD-10-CM

## 2020-02-07 DIAGNOSIS — Z3482 Encounter for supervision of other normal pregnancy, second trimester: Secondary | ICD-10-CM

## 2020-02-07 NOTE — Progress Notes (Signed)
   PRENATAL VISIT NOTE  Subjective:  Tanya Mccullough is a 28 y.o. G3P2002 at [redacted]w[redacted]d being seen today for ongoing prenatal care.  She is currently monitored for the following issues for this low-risk pregnancy and has Supervision of other normal pregnancy, antepartum on their problem list.  Patient reports backache.  Contractions: Not present.  .  Movement: Present. Denies leaking of fluid.   The following portions of the patient's history were reviewed and updated as appropriate: allergies, current medications, past family history, past medical history, past social history, past surgical history and problem list.   Objective:   Vitals:   02/07/20 0816  BP: 115/76  Pulse: 72  Weight: 175 lb 3.2 oz (79.5 kg)    Fetal Status:     Movement: Present     General:  Alert, oriented and cooperative. Patient is in no acute distress.  Skin: Skin is warm and dry. No rash noted.   Cardiovascular: Normal heart rate noted  Respiratory: Normal respiratory effort, no problems with respiration noted  Abdomen: Soft, gravid, appropriate for gestational age.  Pain/Pressure: Absent     Pelvic: Cervical exam deferred        Extremities: Normal range of motion.  Edema: None  Mental Status: Normal mood and affect. Normal behavior. Normal judgment and thought content.   Assessment and Plan:  Pregnancy: G3P2002 at [redacted]w[redacted]d 1. Encounter for supervision of normal first pregnancy in second trimester - CBC - RPR - HIV Antibody (routine testing w rflx) - Glucose Tolerance, 2 Hours w/1 Hour - Routine care - Doula List and water birth information given  - Discussed pain relief measures for back pain (ice, heat, tylenol, maternity support belt). If not improving can consider PT.     Preterm labor symptoms and general obstetric precautions including but not limited to vaginal bleeding, contractions, leaking of fluid and fetal movement were reviewed in detail with the patient. Please refer to After Visit Summary  for other counseling recommendations.   Return in about 2 weeks (around 02/21/2020).  Future Appointments  Date Time Provider Department Center  04/23/2020  9:30 AM [name], NP [facility] None    Thressa Sheller DNP, CNM  02/07/20  8:27 AM

## 2020-02-07 NOTE — Patient Instructions (Addendum)
DOULA LIST   [facility] Doula  [place]  [phone]  [email or web address]  [email or web address]  Zula the [employer] [phone]  [email or web address]   [employer]   Precious Danford Bad   [email or web address]   ??THE MOTHERLY DOULA?? Zola Button   [phone]   [email or web address]     [facility] [employer] Doula  Olive Bass  [phone]    [email or web address]   [employer] [facility]  Angie Rosier     [phone]     [email or web address]   Renato Gails: Doula & Photographer   Renato Gails [phone]       [email or web address]  [email or web address]   [employer] Doula [facility]  [place]en[place] Mattocks [phone]   [email or web address] [place], [place]  Lolita Rieger  [phone]  [email or web address]   [email or web address]   [facility]   Kizzie Furnish   [phone]  [email or web address]   Dina Rich [place] Doula  Dina Rich  [phone] [email or web address]  [facility] Doulas  Cornelious Bryant         [facility]       Lora Reynolds     [phone] [email or web address]  [email or web address]   [facility]   [place] [phone] [email or web address]   Tamsen Meek Doula Services  Camelia Eng     [phone]  [email or web address]  [facility]     [phone]  [email or web address]  Facebook and IG [email or web address] .  [phone] [email or web address] .[phone] [email or web address]   [phone]  [phone] [email or web address]     [facility]  [phone] [email or web address]   IG [email or web address]    [phone]    [phone]  [email or web address]   Lurline Hare "[place]" Free  [phone] [email or web address]    Mtende Roll  [phone] [email or web address]   Susie Williams   [email or web address]    [phone]    [phone] [email or web address]     Denita Lung  [phone] [email or web address]    Lenard Galloway  [phone] [email or web address]    [place]hem    [phone]   [facility] Red wing  [phone] [email or web address]  Doula Mama [phone] [email or web address]      Considering Waterbirth? Guide for patients at [facility] Why consider waterbirth? . Gentle birth for babies  . Less pain medicine used in labor  . May allow for passive descent/less pushing  . May reduce perineal tears  . More mobility and instinctive maternal position changes  . Increased maternal relaxation  . Reduced blood pressure in labor   Is waterbirth safe? What are the risks of infection, drowning or other complications? . Infection:  [phone] Very low risk (3.7 % for tub vs 4.8% for bed)  . 7 in 8000 waterbirths with documented infection  . Poorly cleaned equipment most common cause  . Slightly lower group B strep transmission rate  . Drowning  . Maternal:  . Very low risk  . Related to seizures or fainting  . Newborn:  [employer] Very low risk. No evidence of increased risk of respiratory problems in multiple large studies  . Physiological protection from breathing under water  . Avoid underwater birth if there are any fetal complications  . Once baby's head is out of the water, keep it out.  . Birth complication  . Some reports of cord trauma, but risk decreased by bringing baby to surface gradually  . No evidence of increased risk  of shoulder dystocia. Mothers can usually change positions faster in water than in a bed, possibly aiding the maneuvers to free the shoulder.  ? You must attend a  Waterbirth class at [facility] . 3rd Wednesday of every month from 7-9pm  . Free  . Register by calling [phone] or online at [email or web address]  . Bring [place] the certificate from the class to your prenatal appointment  Meet with a midwife at 36 weeks to see if you can still plan a waterbirth and to sign the consent.   If you plan a waterbirth at [facility], you can opt to purchase the following: . Fish Net . Bathing suit top (optional)  . Long-handled mirror (optional)  .  Things that would prevent you from having a waterbirth: . Unknown or Positive COVID-19 diagnosis upon admission to hospital  . Premature, <37wks  . Previous cesarean birth  . Presence of thick meconium-stained fluid  . Multiple gestation (Twins, triplets, etc.)  . Uncontrolled diabetes or gestational diabetes requiring medication  . Hypertension requiring medication or diagnosis of pre-eclampsia  . Heavy vaginal bleeding  . Non-reassuring fetal heart rate  . Active infection (MRSA, etc.). Group B Strep is NOT a contraindication for waterbirth.  . If your labor has to be induced and induction method requires continuous monitoring of the baby's heart rate  . Other risks/issues identified by your obstetrical provider  Please remember that birth is unpredictable. Under certain unforeseeable circumstances your provider may advise against giving birth in the tub. These decisions will be made on a case-by-case basis and with the safety of you and your baby as our highest priority.  **Please remember that in order to have a waterbirth, you must test Negative to COVID-19 upon admission to the hospital.**

## 2020-02-08 ENCOUNTER — Encounter: Payer: Self-pay | Admitting: Advanced Practice Midwife

## 2020-02-08 LAB — HIV ANTIBODY (ROUTINE TESTING W REFLEX): HIV Screen 4th Generation wRfx: NONREACTIVE

## 2020-02-08 LAB — GLUCOSE TOLERANCE, 2 HOURS W/ 1HR
Glucose, 1 hour: 129 mg/dL (ref 65–179)
Glucose, 2 hour: 142 mg/dL (ref 65–152)
Glucose, Fasting: 80 mg/dL (ref 65–91)

## 2020-02-08 LAB — RPR: RPR Ser Ql: NONREACTIVE

## 2020-02-08 LAB — CBC
Hematocrit: 37.6 % (ref 34.0–46.6)
Hemoglobin: 13 g/dL (ref 11.1–15.9)
MCH: 28.4 pg (ref 26.6–33.0)
MCHC: 34.6 g/dL (ref 31.5–35.7)
MCV: 82 fL (ref 79–97)
Platelets: 216 10*3/uL (ref 150–450)
RBC: 4.57 x10E6/uL (ref 3.77–5.28)
RDW: 13.6 % (ref 11.7–15.4)
WBC: 11.8 10*3/uL — ABNORMAL HIGH (ref 3.4–10.8)

## 2020-02-12 ENCOUNTER — Other Ambulatory Visit: Payer: Self-pay | Admitting: *Deleted

## 2020-02-12 DIAGNOSIS — Z3480 Encounter for supervision of other normal pregnancy, unspecified trimester: Secondary | ICD-10-CM

## 2020-02-12 MED ORDER — PRENATAL GUMMIES/DHA & FA 0.4-32.5 MG PO CHEW: 2.0000 | CHEWABLE_TABLET | Freq: Every day | ORAL | 12 refills | Status: DC

## 2020-02-15 ENCOUNTER — Encounter: Payer: Self-pay | Admitting: General Practice

## 2020-02-18 ENCOUNTER — Encounter: Payer: Self-pay | Admitting: Advanced Practice Midwife

## 2020-02-22 ENCOUNTER — Encounter: Payer: Self-pay | Admitting: General Practice

## 2020-02-22 ENCOUNTER — Telehealth (INDEPENDENT_AMBULATORY_CARE_PROVIDER_SITE_OTHER): Payer: BLUE CROSS/BLUE SHIELD | Admitting: Obstetrics and Gynecology

## 2020-02-22 DIAGNOSIS — Z3A29 29 weeks gestation of pregnancy: Secondary | ICD-10-CM

## 2020-02-22 DIAGNOSIS — Z3483 Encounter for supervision of other normal pregnancy, third trimester: Secondary | ICD-10-CM

## 2020-02-22 DIAGNOSIS — Z3480 Encounter for supervision of other normal pregnancy, unspecified trimester: Secondary | ICD-10-CM

## 2020-02-22 NOTE — Progress Notes (Signed)
   MY CHART VIDEO VIRTUAL OBSTETRICS VISIT ENCOUNTER NOTE  I connected with Tanya Mccullough on 02/22/20 at  8:50 AM EDT by My Chart video at home and verified that I am speaking with the correct person using two identifiers. Provider located at [employer] for [employer] at [place].   I discussed the limitations, risks, security and privacy concerns of performing an evaluation and management service by My Chart video and the availability of in person appointments. I also discussed with the patient that there may be a patient responsible charge related to this service. The patient expressed understanding and agreed to proceed.  Subjective:  Tanya Mccullough is a 29 y.o. G3P2002 at [redacted]w[redacted]d being followed for ongoing prenatal care.  She is currently monitored for the following issues for this low-risk pregnancy and has Supervision of other normal pregnancy, antepartum on their problem list.  Patient reports some swelling of ankles from sitting too long yesterday; resolved now. Reports fetal movement. Denies any contractions, bleeding or leaking of fluid.   The following portions of the patient's history were reviewed and updated as appropriate: allergies, current medications, past family history, past medical history, past social history, past surgical history and problem list.   Objective:   General:  Alert, oriented and cooperative.   Mental Status: Normal mood and affect perceived. Normal judgment and thought content.  Rest of physical exam deferred due to type of encounter  BP 129/74   Pulse 95   LMP 08/01/2019 (Exact Date)  **Done by patient's own at home BP cuff and scale  Assessment and Plan:  Pregnancy: G3P2002 at [redacted]w[redacted]d  1. Supervision of other normal pregnancy, antepartum - Normal GTT - Anticipatory guidance for next visit being My Chart  Preterm labor symptoms and general obstetric precautions including but not limited to vaginal bleeding, contractions, leaking of fluid and  fetal movement were reviewed in detail with the patient.  I discussed the assessment and treatment plan with the patient. The patient was provided an opportunity to ask questions and all were answered. The patient agreed with the plan and demonstrated an understanding of the instructions. The patient was advised to call back or seek an in-person office evaluation/go to MAU at [facility] for any urgent or concerning symptoms. Please refer to After Visit Summary for other counseling recommendations.   I provided 5 minutes of non-face-to-face time during this encounter. There was 5 minutes of chart review time spent prior to this encounter. Total time spent = 10 minutes.  Return in about 3 weeks (around 03/14/2020) for Return OB - My Chart video.  Future Appointments  Date Time Provider Department Center  04/23/2020  9:30 AM Lomax, Amy, NP [facility] None    Tanya Mccullough, CNM [facility] [employer], [facility]

## 2020-02-23 DIAGNOSIS — Z3480 Encounter for supervision of other normal pregnancy, unspecified trimester: Secondary | ICD-10-CM

## 2020-02-23 DIAGNOSIS — Z789 Other specified health status: Secondary | ICD-10-CM

## 2020-02-28 ENCOUNTER — Other Ambulatory Visit: Payer: Self-pay | Admitting: Medical

## 2020-02-28 MED ORDER — MEDELA DOUBLE BREAST PUMP MISC: 1.0000 | 0 refills | Status: DC | PRN

## 2020-03-04 ENCOUNTER — Other Ambulatory Visit: Payer: Self-pay

## 2020-03-04 ENCOUNTER — Inpatient Hospital Stay (HOSPITAL_COMMUNITY)
Admission: AD | Admit: 2020-03-04 | Discharge: 2020-03-04 | Disposition: A | Discharge status: Home / Self Care | Payer: BLUE CROSS/BLUE SHIELD | Attending: Obstetrics and Gynecology | Admitting: Obstetrics and Gynecology

## 2020-03-04 ENCOUNTER — Encounter (HOSPITAL_COMMUNITY): Payer: Self-pay | Admitting: Obstetrics and Gynecology

## 2020-03-04 DIAGNOSIS — R197 Diarrhea, unspecified: Secondary | ICD-10-CM | POA: Insufficient documentation

## 2020-03-04 DIAGNOSIS — Z3480 Encounter for supervision of other normal pregnancy, unspecified trimester: Secondary | ICD-10-CM

## 2020-03-04 DIAGNOSIS — Z3A30 30 weeks gestation of pregnancy: Secondary | ICD-10-CM

## 2020-03-04 DIAGNOSIS — O4703 False labor before 37 completed weeks of gestation, third trimester: Secondary | ICD-10-CM | POA: Diagnosis not present

## 2020-03-04 DIAGNOSIS — O479 False labor, unspecified: Secondary | ICD-10-CM

## 2020-03-04 LAB — URINALYSIS, ROUTINE W REFLEX MICROSCOPIC
Bilirubin Urine: NEGATIVE
Glucose, UA: 50 mg/dL — AB
Hgb urine dipstick: NEGATIVE
Ketones, ur: NEGATIVE mg/dL
Leukocytes,Ua: NEGATIVE
Nitrite: NEGATIVE
Protein, ur: NEGATIVE mg/dL
Specific Gravity, Urine: 1.002 — ABNORMAL LOW (ref 1.005–1.030)
pH: 6 (ref 5.0–8.0)

## 2020-03-04 IMAGING — US US OB < 14 WEEKS - US OB TV
1 series · 15 of 28 positions shown · non-contrast
Comparison: None.

CLINICAL DATA: Pregnant patient in first-trimester pregnancy with
abdominal pain.

EXAM:
OBSTETRIC <14 WK US AND TRANSVAGINAL OB US
TECHNIQUE: Both transabdominal and transvaginal ultrasound examinations were
performed for complete evaluation of the gestation as well as the
maternal uterus, adnexal regions, and pelvic cul-de-sac.
Transvaginal technique was performed to assess early pregnancy.

[Series 1: us ob < 14 weeks - us ob tv · 15 of 36 slices shown]
[im 1/36]
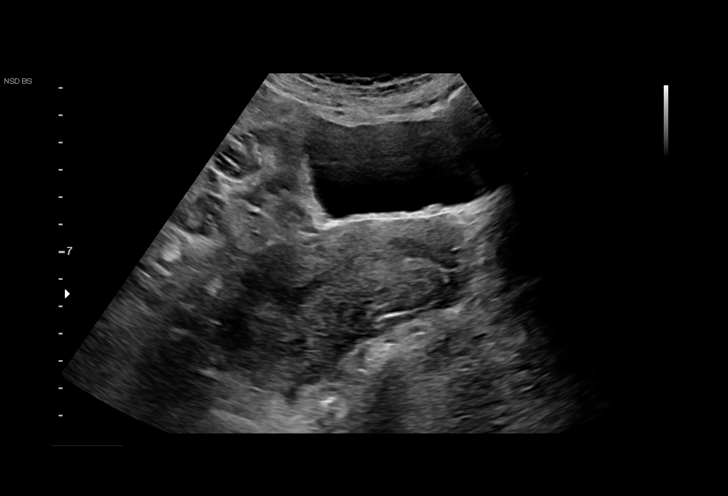
[im 3/36]
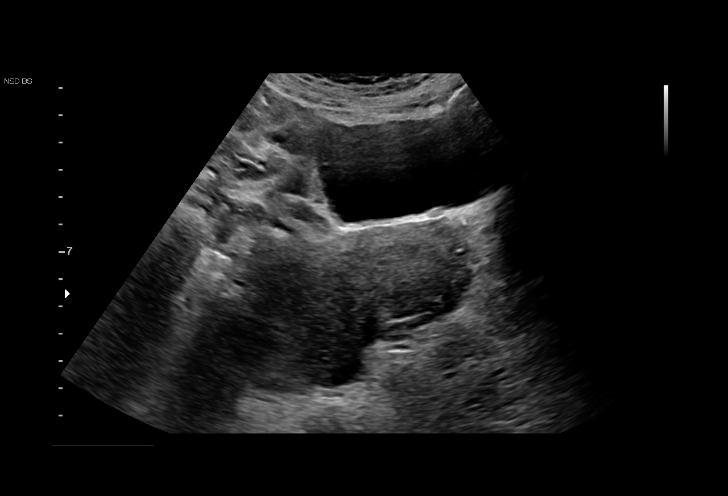
[im 6/36]
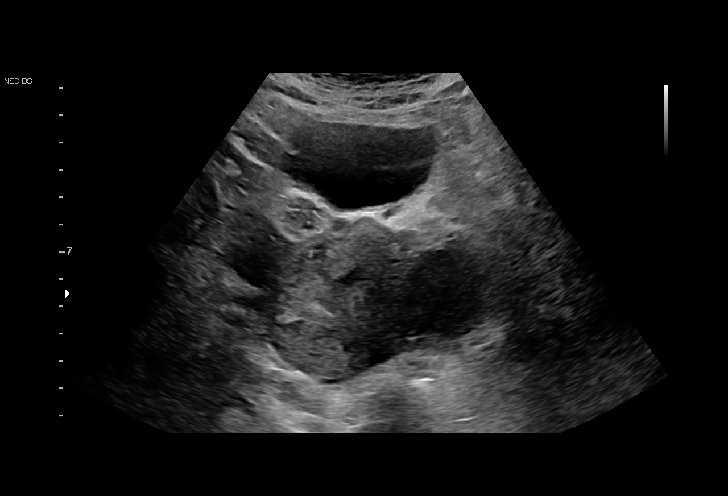
[im 8/36]
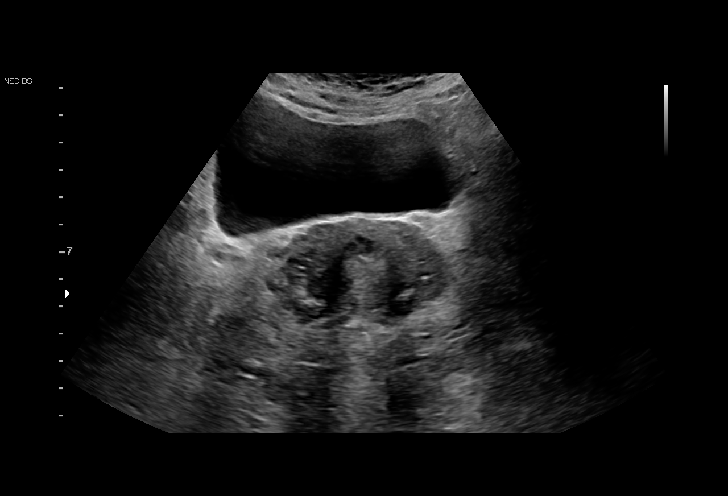
[im 11/36]
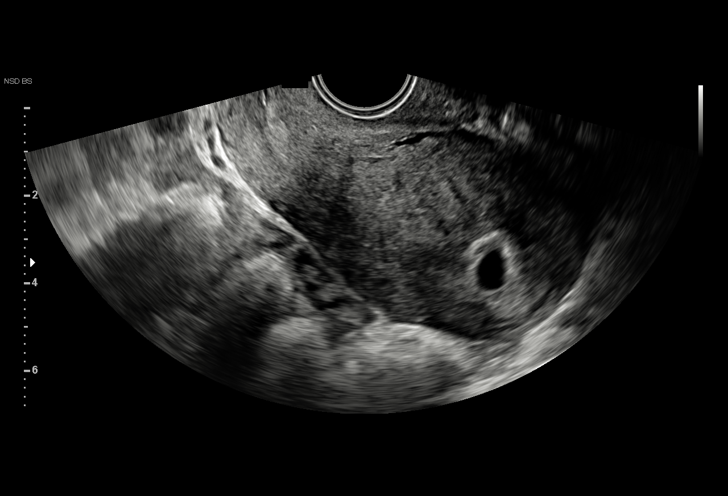
[im 13/36]
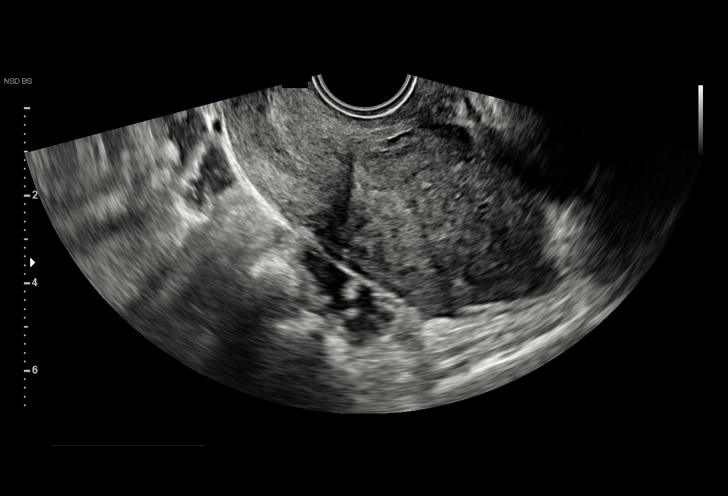
[im 16/36]
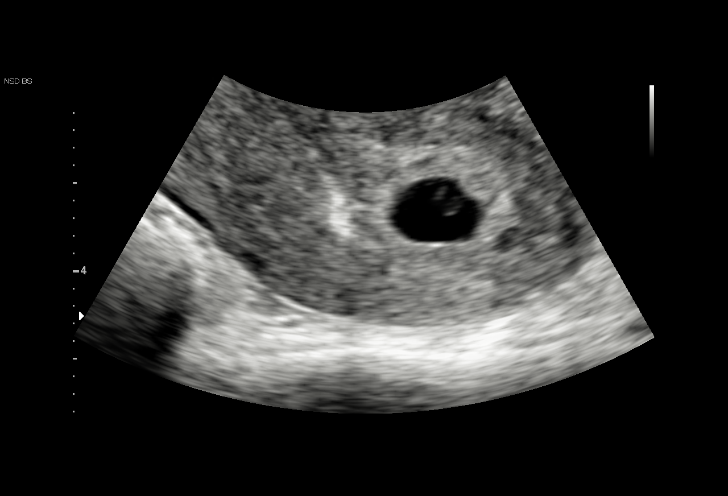
[im 19/36]
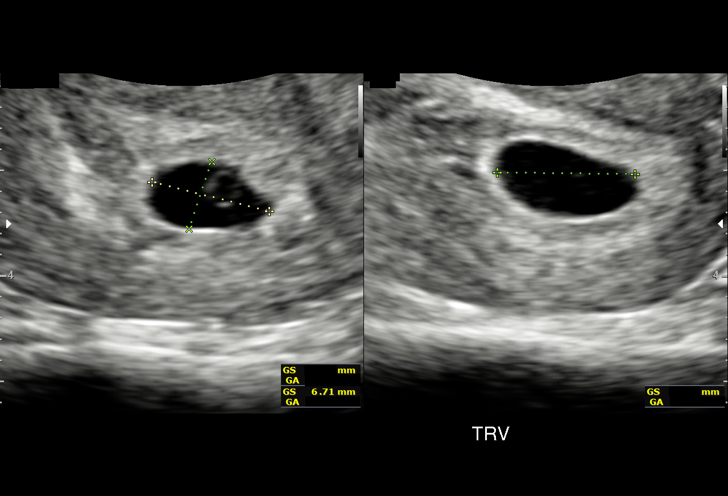
[im 20/36]
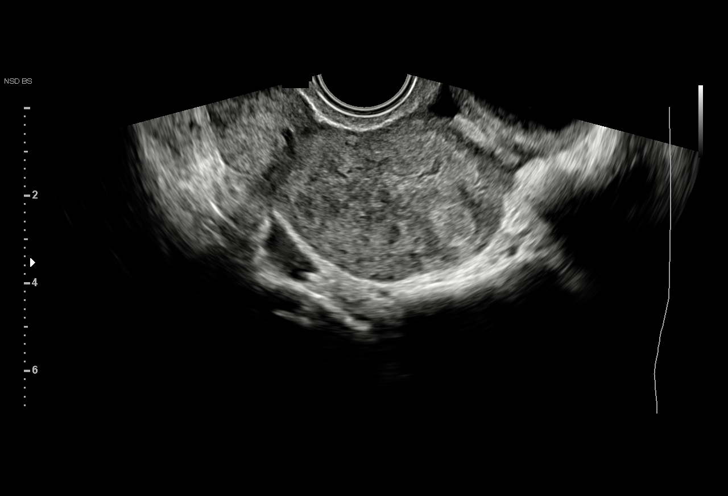
[im 23/36]
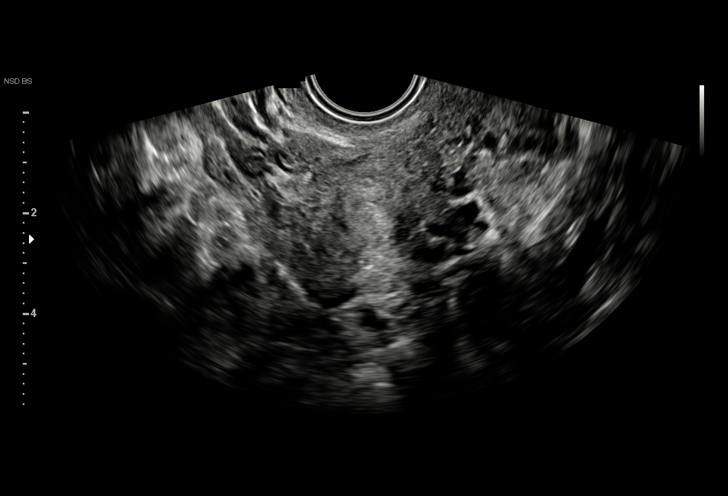
[im 25/36]
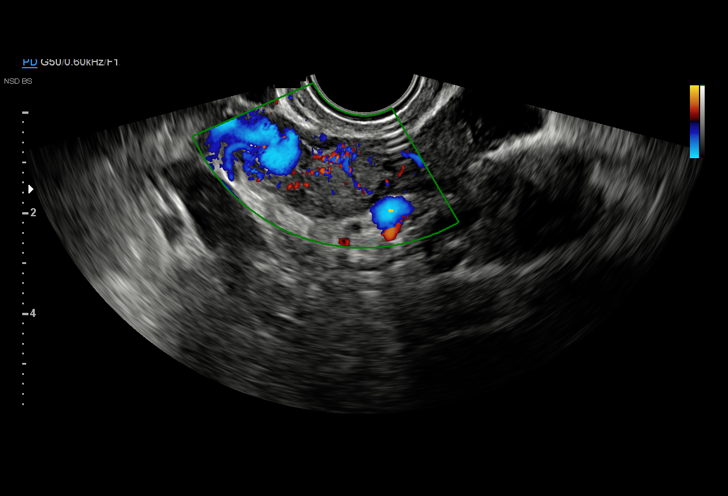
[im 28/36]
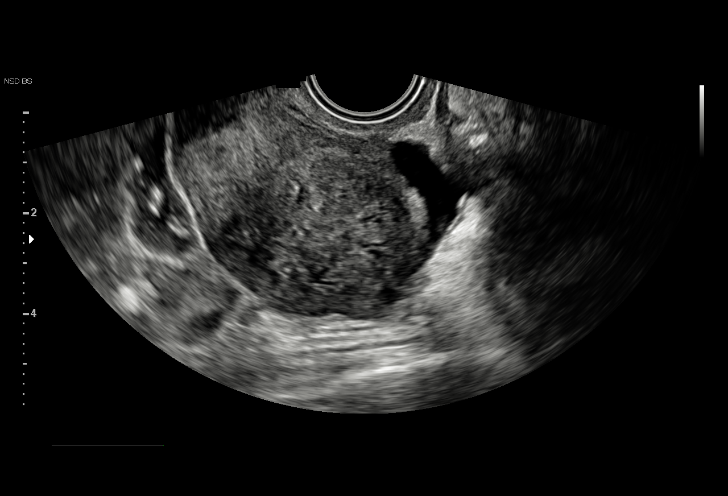
[im 30/36]
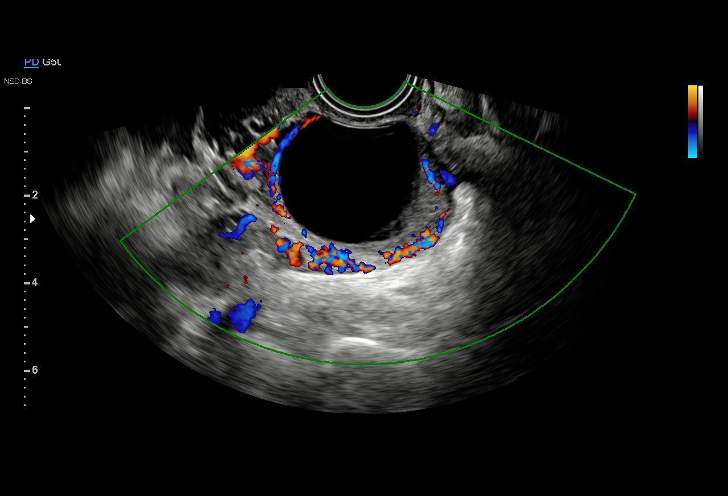
[im 33/36]
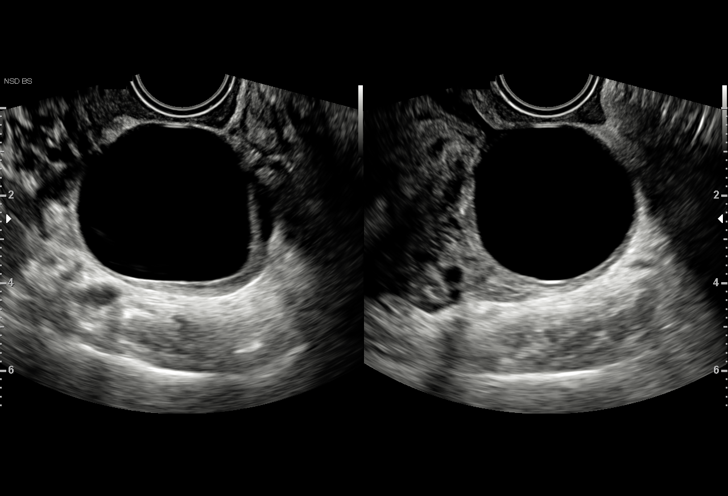
[im 36/36]
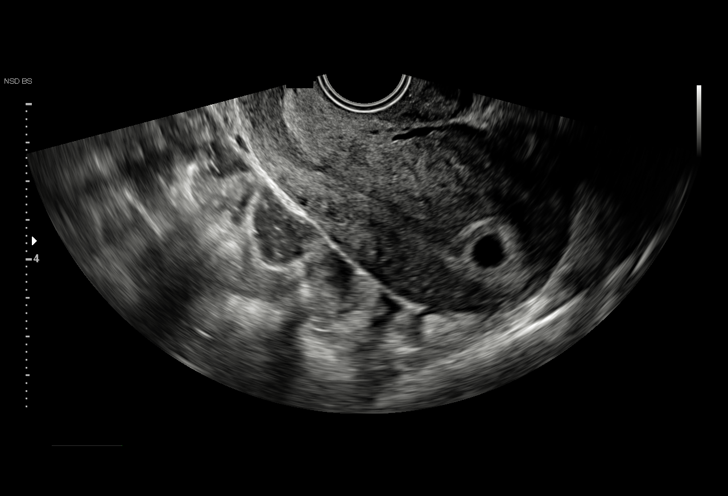

[15 of 28 positions shown; findings below may reference images not displayed]

FINDINGS: Intrauterine gestational sac: Single

Yolk sac:  Visualized.

Embryo:  Not Visualized.

Cardiac Activity: Not Visualized.

MSD: 10.4 mm   5 w   5 d

Subchorionic hemorrhage:  None visualized.

Maternal uterus/adnexae: The right ovary appears normal. There is a
4.2 x 3.7 x 3.9 cm cyst in the left ovary. Blood flow is seen to the
adjacent ovarian parenchyma. Small pelvic free fluid. No suspicious
adnexal mass.
IMPRESSION: 1. Probable early intrauterine gestational sac with a yolk sac, but
no fetal pole or cardiac activity yet visualized. Recommend
follow-up quantitative B-HCG levels and follow-up US in 10-14 days
to assess viability. No subchorionic hemorrhage.
2. Left ovarian cyst measures 4.2 cm. Blood flow is noted to the
ovarian parenchyma.

## 2020-03-04 NOTE — MAU Provider Note (Signed)
Chief Complaint:  Contractions   First Provider Initiated Contact with Patient 03/04/20 1517     HPI: Tanya Mccullough is a 29 y.o. G3P2002 at 23w6dwho presents to maternity admissions reporting contractions. Started this morning. Occurs 1-2 times per hour. Are not painful, just feels tightening. Denies n/v, dysuria, vaginal bleeding, or LOF. Good fetal movement. Has had 4 episodes of diarrhea since yesterday morning.   Past Medical History:  Diagnosis Date  . Migraines   . Vaginal Pap smear, abnormal    OB History  Gravida Para Term Preterm AB Living  '3 2 2 '$ 0 0 2  SAB TAB Ectopic Multiple Live Births  0 0 0 0 2    # Outcome Date GA Lbr Len/2nd Weight Sex Delivery Anes PTL Lv  3 Current           2 Term      Vag-Spont   LIV  1 Term      Vag-Spont   LIV   Past Surgical History:  Procedure Laterality Date  . CHOLECYSTECTOMY    . GALLBLADDER SURGERY    . TONSILECTOMY, ADENOIDECTOMY, BILATERAL MYRINGOTOMY AND TUBES     Family History  Problem Relation Age of Onset  . Healthy Mother   . Other Father        murdered  . Diabetes Maternal Grandmother   . Hypertension Maternal Grandmother   . Migraines Cousin   . ADD / ADHD Son   . Asthma Son   . Cancer Maternal Aunt   . Hypertension Maternal Aunt    Social History   Tobacco Use  . Smoking status: Never Smoker  . Smokeless tobacco: Never Used  Vaping Use  . Vaping Use: Never used  Substance Use Topics  . Alcohol use: Not Currently    Comment: 1 glass every days  . Drug use: Never   Allergies  Allergen Reactions  . Prochlorperazine Other (See Comments)    Lip twitching   No medications prior to admission.    I have reviewed patient's Past Medical Hx, Surgical Hx, Family Hx, Social Hx, medications and allergies.   ROS:  Review of Systems  Constitutional: Negative.   Gastrointestinal: Positive for diarrhea. Negative for abdominal pain, constipation, nausea and vomiting.  Genitourinary: Negative.     Physical  Exam   Patient Vitals for the past 24 hrs:  BP Temp Temp src Pulse Resp SpO2 Height  03/04/20 1623 (!) 104/57 -- -- 88 16 -- --  03/04/20 1440 132/75 98.8 F (37.1 C) Oral (!) 116 18 100 % '5\' 7"'$  (1.702 m)    Constitutional: Well-developed, well-nourished female in no acute distress.  Cardiovascular: normal rate & rhythm, no murmur Respiratory: normal effort, lung sounds clear throughout GI: Abd soft, non-tender, gravid appropriate for gestational age. Pos BS x 4 MS: Extremities nontender, no edema, normal ROM Neurologic: Alert and oriented x 4.  GU:    Dilation: Closed Effacement (%): Thick Cervical Position: Posterior Exam by:: EJorje GuildNP  Fetal Tracing:  Baseline: 155 Variability: moderate Accelerations: 15x15 Decelerations: none  Toco: Q15 minutes    Labs: Results for orders placed or performed during the hospital encounter of 03/04/20 (from the past 24 hour(s))  Urinalysis, Routine w reflex microscopic     Status: Abnormal   Collection Time: 03/04/20  2:46 PM  Result Value Ref Range   Color, Urine STRAW (A) YELLOW   APPearance CLEAR CLEAR   Specific Gravity, Urine 1.002 (L) 1.005 - 1.030   pH  6.0 5.0 - 8.0   Glucose, UA 50 (A) NEGATIVE mg/dL   Hgb urine dipstick NEGATIVE NEGATIVE   Bilirubin Urine NEGATIVE NEGATIVE   Ketones, ur NEGATIVE NEGATIVE mg/dL   Protein, ur NEGATIVE NEGATIVE mg/dL   Nitrite NEGATIVE NEGATIVE   Leukocytes,Ua NEGATIVE NEGATIVE    Imaging:  No results found.  MAU Course: Orders Placed This Encounter  Procedures  . Urinalysis, Routine w reflex microscopic  . Discharge patient   No orders of the defined types were placed in this encounter.   MDM: Ctx not regular on monitor. Cervix closed/thick. Pt reassured.  Fetal tracing had prolonged accel vs fetal tachycardia; down to 150's; tracing reviewed with Dr. Elgie Congo and patient ok to go home.   Assessment: 1. Braxton Hick's contraction   2. [redacted] weeks gestation of pregnancy   3.  Supervision of other normal pregnancy, antepartum     Plan: Discharge home in stable condition.  Discussed reasons to return to MAU    Allergies as of 03/04/2020      Reactions   Prochlorperazine Other (See Comments)   Lip twitching      Medication List    STOP taking these medications   Butalbital-APAP-Caffeine 50-325-40 MG capsule   cephALEXin 500 MG capsule Commonly known as: KEFLEX     TAKE these medications   Blood Pressure Kit Devi 1 kit by Does not apply route once a week. Check Blood Pressure regularly and record readings into the [employer] App.  Large Cuff.  DX O90.0   Medela Double Breast Pump Misc 1 Pump by Does not apply route as needed.   Prenatal Gummies/DHA & FA 0.4-32.5 MG Chew Chew 2 each by mouth daily. 1-2 gummy by mouth daily.       Jorje Guild, NP 03/04/2020 7:15 PM

## 2020-03-04 NOTE — Discharge Instructions (Signed)
Fetal Movement Counts Patient Name: ________________________________________________ Patient Due Date: ____________________ What is a fetal movement count?  A fetal movement count is the number of times that you feel your baby move during a certain amount of time. This may also be called a fetal kick count. A fetal movement count is recommended for every pregnant woman. You may be asked to start counting fetal movements as early as week 28 of your pregnancy. Pay attention to when your baby is most active. You may notice your baby's sleep and wake cycles. You may also notice things that make your baby move more. You should do a fetal movement count:  When your baby is normally most active.  At the same time each day. A good time to count movements is while you are resting, after having something to eat and drink. How do I count fetal movements? 1. Find a quiet, comfortable area. Sit, or lie down on your side. 2. Write down the date, the start time and stop time, and the number of movements that you felt between those two times. Take this information with you to your health care visits. 3. Write down your start time when you feel the first movement. 4. Count kicks, flutters, swishes, rolls, and jabs. You should feel at least 10 movements. 5. You may stop counting after you have felt 10 movements, or if you have been counting for 2 hours. Write down the stop time. 6. If you do not feel 10 movements in 2 hours, contact your health care provider for further instructions. Your health care provider may want to do additional tests to assess your baby's well-being. Contact a health care provider if:  You feel fewer than 10 movements in 2 hours.  Your baby is not moving like he or she usually does. Date: ____________ Start time: ____________ Stop time: ____________ Movements: ____________ Date: ____________ Start time: ____________ Stop time: ____________ Movements: ____________ Date: ____________  Start time: ____________ Stop time: ____________ Movements: ____________ Date: ____________ Start time: ____________ Stop time: ____________ Movements: ____________ Date: ____________ Start time: ____________ Stop time: ____________ Movements: ____________ Date: ____________ Start time: ____________ Stop time: ____________ Movements: ____________ Date: ____________ Start time: ____________ Stop time: ____________ Movements: ____________ Date: ____________ Start time: ____________ Stop time: ____________ Movements: ____________ Date: ____________ Start time: ____________ Stop time: ____________ Movements: ____________ This information is not intended to replace advice given to you by your health care provider. Make sure you discuss any questions you have with your health care provider. Document Revised: 03/23/2019 Document Reviewed: 03/23/2019 Elsevier Patient Education  2020 [employer] Braxton Hicks Contractions Contractions of the uterus can occur throughout pregnancy, but they are not always a sign that you are in labor. You may have practice contractions called Braxton Hicks contractions. These false labor contractions are sometimes confused with true labor. What are Braxton Hicks contractions? Braxton Hicks contractions are tightening movements that occur in the muscles of the uterus before labor. Unlike true labor contractions, these contractions do not result in opening (dilation) and thinning of the cervix. Toward the end of pregnancy (32-34 weeks), Braxton Hicks contractions can happen more often and may become stronger. These contractions are sometimes difficult to tell apart from true labor because they can be very uncomfortable. You should not feel embarrassed if you go to the hospital with false labor. Sometimes, the only way to tell if you are in true labor is for your health care provider to look for changes in the cervix. The health care provider   will do a physical exam and may  monitor your contractions. If you are not in true labor, the exam should show that your cervix is not dilating and your water has not broken. If there are no other health problems associated with your pregnancy, it is completely safe for you to be sent home with false labor. You may continue to have Braxton Hicks contractions until you go into true labor. How to tell the difference between true labor and false labor True labor  Contractions last 30-70 seconds.  Contractions become very regular.  Discomfort is usually felt in the top of the uterus, and it spreads to the lower abdomen and low back.  Contractions do not go away with walking.  Contractions usually become more intense and increase in frequency.  The cervix dilates and gets thinner. False labor  Contractions are usually shorter and not as strong as true labor contractions.  Contractions are usually irregular.  Contractions are often felt in the front of the lower abdomen and in the groin.  Contractions may go away when you walk around or change positions while lying down.  Contractions get weaker and are shorter-lasting as time goes on.  The cervix usually does not dilate or become thin. Follow these instructions at home:   Take over-the-counter and prescription medicines only as told by your health care provider.  Keep up with your usual exercises and follow other instructions from your health care provider.  Eat and drink lightly if you think you are going into labor.  If Braxton Hicks contractions are making you uncomfortable: ? Change your position from lying down or resting to walking, or change from walking to resting. ? Sit and rest in a tub of warm water. ? Drink enough fluid to keep your urine pale yellow. Dehydration may cause these contractions. ? Do slow and deep breathing several times an hour.  Keep all follow-up prenatal visits as told by your health care provider. This is important. Contact a  health care provider if:  You have a fever.  You have continuous pain in your abdomen. Get help right away if:  Your contractions become stronger, more regular, and closer together.  You have fluid leaking or gushing from your vagina.  You pass blood-tinged mucus (bloody show).  You have bleeding from your vagina.  You have low back pain that you never had before.  You feel your baby's head pushing down and causing pelvic pressure.  Your baby is not moving inside you as much as it used to. Summary  Contractions that occur before labor are called Braxton Hicks contractions, false labor, or practice contractions.  Braxton Hicks contractions are usually shorter, weaker, farther apart, and less regular than true labor contractions. True labor contractions usually become progressively stronger and regular, and they become more frequent.  Manage discomfort from Braxton Hicks contractions by changing position, resting in a warm bath, drinking plenty of water, or practicing deep breathing. This information is not intended to replace advice given to you by your health care provider. Make sure you discuss any questions you have with your health care provider. Document Revised: 07/16/2017 Document Reviewed: 12/17/2016 Elsevier Patient Education  2020 Elsevier Inc.  

## 2020-03-04 NOTE — MAU Note (Signed)
Having pelvic pain and CTX for past few days, today more frequent about apart.  Unsure if braxton hicks or not.  Denies LOF or vaginal bleeding.  Reports good fetal movement.

## 2020-03-14 ENCOUNTER — Telehealth (INDEPENDENT_AMBULATORY_CARE_PROVIDER_SITE_OTHER): Payer: BLUE CROSS/BLUE SHIELD | Admitting: Obstetrics and Gynecology

## 2020-03-14 ENCOUNTER — Encounter: Payer: Self-pay | Admitting: Obstetrics and Gynecology

## 2020-03-14 DIAGNOSIS — Z3480 Encounter for supervision of other normal pregnancy, unspecified trimester: Secondary | ICD-10-CM

## 2020-03-14 MED ORDER — GOJJI WEIGHT SCALE MISC: 1.0000 | Freq: Every day | 0 refills | Status: DC | PRN

## 2020-03-14 NOTE — Patient Instructions (Signed)

## 2020-03-17 NOTE — Progress Notes (Signed)
   MY CHART VIDEO VIRTUAL OBSTETRICS VISIT ENCOUNTER NOTE  I connected with Tanya Mccullough on 03/17/20 at  2:50 PM EDT by My Chart video at home and verified that I am speaking with the correct person using two identifiers. Provider located at [facility] for [employer] at [place].   I discussed the limitations, risks, security and privacy concerns of performing an evaluation and management service by My Chart video and the availability of in person appointments. I also discussed with the patient that there may be a patient responsible charge related to this service. The patient expressed understanding and agreed to proceed.  Subjective:  Tanya Mccullough is a 29 y.o. G3P2002 at [redacted]w[redacted]d being followed for ongoing prenatal care.  She is currently monitored for the following issues for this low-risk pregnancy and has Supervision of other normal pregnancy, antepartum on their problem list.  Patient reports swollen feet and increased pelvic pressure. Reports fetal movement. Denies any contractions, bleeding or leaking of fluid.   The following portions of the patient's history were reviewed and updated as appropriate: allergies, current medications, past family history, past medical history, past social history, past surgical history and problem list.   Objective:   General:  Alert, oriented and cooperative.   Mental Status: Normal mood and affect perceived. Normal judgment and thought content.  Rest of physical exam deferred due to type of encounter  BP (!) 135/71   Pulse 100   LMP 08/01/2019 (Exact Date)  **Done by patient's own at home BP cuff and scale  Assessment and Plan:  Pregnancy: G3P2002 at [redacted]w[redacted]d  1. Supervision of other normal pregnancy, antepartum - Advised to use the maternity support belt she has at home, can take Tylenol 1000 mg every 6 hours prn and soak in tub of warm water - Misc. Devices (GOJJI WEIGHT SCALE) MISC; 1 Device by Does not apply route daily as needed. To  weight self daily as needed at home. ICD-10 code: Z34.90  Dispense: 1 each; Refill: 0  Preterm labor symptoms and general obstetric precautions including but not limited to vaginal bleeding, contractions, leaking of fluid and fetal movement were reviewed in detail with the patient.  I discussed the assessment and treatment plan with the patient. The patient was provided an opportunity to ask questions and all were answered. The patient agreed with the plan and demonstrated an understanding of the instructions. The patient was advised to call back or seek an in-person office evaluation/go to MAU at [facility] for any urgent or concerning symptoms. Please refer to After Visit Summary for other counseling recommendations.   I provided 10 minutes of non-face-to-face time during this encounter. There was 5 minutes of chart review time spent prior to this encounter. Total time spent = 15 minutes.  Return in about 4 weeks (around 04/11/2020) for Return OB w/GBS.  Future Appointments  Date Time Provider Department Center  04/11/2020  9:30 AM Raelyn Mora, CNM [facility] None  04/23/2020  9:30 AM Lomax, Amy, NP [facility] None    Raelyn Mora, CNM [facility], [facility]

## 2020-03-24 ENCOUNTER — Other Ambulatory Visit: Payer: Self-pay

## 2020-03-24 ENCOUNTER — Inpatient Hospital Stay (HOSPITAL_COMMUNITY)
Admission: AD | Admit: 2020-03-24 | Discharge: 2020-03-24 | Disposition: A | Discharge status: Home / Self Care | Payer: BLUE CROSS/BLUE SHIELD | Attending: Obstetrics & Gynecology | Admitting: Obstetrics & Gynecology

## 2020-03-24 DIAGNOSIS — Z3A33 33 weeks gestation of pregnancy: Secondary | ICD-10-CM

## 2020-03-24 DIAGNOSIS — Z79899 Other long term (current) drug therapy: Secondary | ICD-10-CM | POA: Diagnosis not present

## 2020-03-24 DIAGNOSIS — Z9049 Acquired absence of other specified parts of digestive tract: Secondary | ICD-10-CM | POA: Diagnosis not present

## 2020-03-24 DIAGNOSIS — Z888 Allergy status to other drugs, medicaments and biological substances status: Secondary | ICD-10-CM | POA: Insufficient documentation

## 2020-03-24 DIAGNOSIS — O4703 False labor before 37 completed weeks of gestation, third trimester: Secondary | ICD-10-CM

## 2020-03-24 DIAGNOSIS — Z3689 Encounter for other specified antenatal screening: Secondary | ICD-10-CM

## 2020-03-24 DIAGNOSIS — O479 False labor, unspecified: Secondary | ICD-10-CM

## 2020-03-24 LAB — URINALYSIS, ROUTINE W REFLEX MICROSCOPIC
Bilirubin Urine: NEGATIVE
Glucose, UA: 50 mg/dL — AB
Hgb urine dipstick: NEGATIVE
Ketones, ur: NEGATIVE mg/dL
Leukocytes,Ua: NEGATIVE
Nitrite: NEGATIVE
Protein, ur: NEGATIVE mg/dL
Specific Gravity, Urine: 1.004 — ABNORMAL LOW (ref 1.005–1.030)
pH: 6 (ref 5.0–8.0)

## 2020-03-24 MED ORDER — LACTATED RINGERS IV BOLUS
1000.0000 mL | Freq: Once | INTRAVENOUS | Status: AC
  Administered 2020-03-24: 1000 mL via INTRAVENOUS

## 2020-03-24 MED ORDER — CYCLOBENZAPRINE HCL 5 MG PO TABS
10.0000 mg | ORAL_TABLET | Freq: Once | ORAL | Status: AC
  Administered 2020-03-24: 10 mg via ORAL
  Filled 2020-03-24: qty 2

## 2020-03-24 MED ORDER — CYCLOBENZAPRINE HCL 10 MG PO TABS
10.0000 mg | ORAL_TABLET | Freq: Two times a day (BID) | ORAL | 0 refills | Status: DC | PRN
Start: 2020-03-24 — End: 2020-04-19

## 2020-03-24 MED ORDER — NIFEDIPINE 10 MG PO CAPS
10.0000 mg | ORAL_CAPSULE | ORAL | Status: AC | PRN
  Administered 2020-03-24 (×3): 10 mg via ORAL
  Filled 2020-03-24 (×3): qty 1

## 2020-03-24 NOTE — MAU Note (Signed)
Pt. Reports to mau with c/o ctx q 8-6min since yesterday.  Pt reports they started after she had intercourse yesterday and continued throughout the day and all night.  Pt reports she has been drinking "plenty of fluids" and rested but ctx have continued.  Pt denies LOF or vag bleeding. Reports +FM

## 2020-03-24 NOTE — Discharge Instructions (Signed)
Braxton Hicks Contractions °Contractions of the uterus can occur throughout pregnancy, but they are not always a sign that you are in labor. You may have practice contractions called Braxton Hicks contractions. These false labor contractions are sometimes confused with true labor. °What are Braxton Hicks contractions? °Braxton Hicks contractions are tightening movements that occur in the muscles of the uterus before labor. Unlike true labor contractions, these contractions do not result in opening (dilation) and thinning of the cervix. Toward the end of pregnancy (32-34 weeks), Braxton Hicks contractions can happen more often and may become stronger. These contractions are sometimes difficult to tell apart from true labor because they can be very uncomfortable. You should not feel embarrassed if you go to the hospital with false labor. °Sometimes, the only way to tell if you are in true labor is for your health care provider to look for changes in the cervix. The health care provider will do a physical exam and may monitor your contractions. If you are not in true labor, the exam should show that your cervix is not dilating and your water has not broken. °If there are no other health problems associated with your pregnancy, it is completely safe for you to be sent home with false labor. You may continue to have Braxton Hicks contractions until you go into true labor. °How to tell the difference between true labor and false labor °True labor °· Contractions last 30-70 seconds. °· Contractions become very regular. °· Discomfort is usually felt in the top of the uterus, and it spreads to the lower abdomen and low back. °· Contractions do not go away with walking. °· Contractions usually become more intense and increase in frequency. °· The cervix dilates and gets thinner. °False labor °· Contractions are usually shorter and not as strong as true labor contractions. °· Contractions are usually irregular. °· Contractions  are often felt in the front of the lower abdomen and in the groin. °· Contractions may go away when you walk around or change positions while lying down. °· Contractions get weaker and are shorter-lasting as time goes on. °· The cervix usually does not dilate or become thin. °Follow these instructions at home: ° °· Take over-the-counter and prescription medicines only as told by your health care provider. °· Keep up with your usual exercises and follow other instructions from your health care provider. °· Eat and drink lightly if you think you are going into labor. °· If Braxton Hicks contractions are making you uncomfortable: °? Change your position from lying down or resting to walking, or change from walking to resting. °? Sit and rest in a tub of warm water. °? Drink enough fluid to keep your urine pale yellow. Dehydration may cause these contractions. °? Do slow and deep breathing several times an hour. °· Keep all follow-up prenatal visits as told by your health care provider. This is important. °Contact a health care provider if: °· You have a fever. °· You have continuous pain in your abdomen. °Get help right away if: °· Your contractions become stronger, more regular, and closer together. °· You have fluid leaking or gushing from your vagina. °· You pass blood-tinged mucus (bloody show). °· You have bleeding from your vagina. °· You have low back pain that you never had before. °· You feel your baby’s head pushing down and causing pelvic pressure. °· Your baby is not moving inside you as much as it used to. °Summary °· Contractions that occur before labor are   called Braxton Hicks contractions, false labor, or practice contractions. °· Braxton Hicks contractions are usually shorter, weaker, farther apart, and less regular than true labor contractions. True labor contractions usually become progressively stronger and regular, and they become more frequent. °· Manage discomfort from Braxton Hicks contractions  by changing position, resting in a warm bath, drinking plenty of water, or practicing deep breathing. °This information is not intended to replace advice given to you by your health care provider. Make sure you discuss any questions you have with your health care provider. °Document Revised: 07/16/2017 Document Reviewed: 12/17/2016 °Elsevier Patient Education © 2020 Elsevier Inc. ° °

## 2020-03-24 NOTE — MAU Provider Note (Signed)
History     CSN: [id number]  Arrival date and time: 03/24/20 0910   First Provider Initiated Contact with Patient 03/24/20 0934      Chief Complaint  Patient presents with  . Contractions   HPI Tanya Mccullough is a 29 y.o. G3P2002 at [redacted]w[redacted]d who presents to MAU with chief complaint of preterm contractions. This is a recurrent problem, current episode began yesterday morning after sexual intercourse. Patient endorses up to 15 minutes between contractions since that time. She has attempted management with rest and PO hydration but has not experienced relief. She has not taken medication for this complaint. She denies vaginal bleeding, leaking of fluid, decreased fetal movement, fever, falls, or recent illness.   Patient receives care with [facility] and her next appointment is 04/11/2020.   OB History    Gravida  3   Para  2   Term  2   Preterm  0   AB  0   Living  2     SAB  0   TAB  0   Ectopic  0   Multiple  0   Live Births  2           Past Medical History:  Diagnosis Date  . Migraines   . Vaginal Pap smear, abnormal     Past Surgical History:  Procedure Laterality Date  . CHOLECYSTECTOMY    . GALLBLADDER SURGERY    . TONSILECTOMY, ADENOIDECTOMY, BILATERAL MYRINGOTOMY AND TUBES      Family History  Problem Relation Age of Onset  . Healthy Mother   . Other Father        murdered  . Diabetes Maternal Grandmother   . Hypertension Maternal Grandmother   . Migraines Cousin   . ADD / ADHD Son   . Asthma Son   . Cancer Maternal Aunt   . Hypertension Maternal Aunt     Social History   Tobacco Use  . Smoking status: Never Smoker  . Smokeless tobacco: Never Used  Vaping Use  . Vaping Use: Never used  Substance Use Topics  . Alcohol use: Not Currently    Comment: 1 glass every days  . Drug use: Never    Allergies:  Allergies  Allergen Reactions  . Prochlorperazine Other (See Comments)    Lip twitching    Medications Prior to  Admission  Medication Sig Dispense Refill Last Dose  . Blood Pressure Monitoring (BLOOD PRESSURE KIT) DEVI 1 kit by Does not apply route once a week. Check Blood Pressure regularly and record readings into the [employer] App.  Large Cuff.  DX O90.0 1 each 0   . Misc. Devices (GOJJI WEIGHT SCALE) MISC 1 Device by Does not apply route daily as needed. To weight self daily as needed at home. ICD-10 code: Z34.90 1 each 0   . Misc. Devices (MEDELA DOUBLE BREAST PUMP) MISC 1 Pump by Does not apply route as needed. 1 each 0   . Prenatal MV-Min-FA-Omega-3 (PRENATAL GUMMIES/DHA & FA) 0.4-32.5 MG CHEW Chew 2 each by mouth daily. 1-2 gummy by mouth daily. 60 tablet 12     Review of Systems  Gastrointestinal: Positive for abdominal pain.  Genitourinary: Negative for vaginal bleeding.  Musculoskeletal: Negative for back pain.  All other systems reviewed and are negative.  Physical Exam   Blood pressure 112/72, pulse 81, temperature 98.2 F (36.8 C), resp. rate 16, last menstrual period 08/01/2019, SpO2 97 %.  Physical Exam Vitals and nursing note reviewed. Exam   conducted with a chaperone present.  Cardiovascular:     Rate and Rhythm: Normal rate.  Pulmonary:     Effort: Pulmonary effort is normal.     Breath sounds: Normal breath sounds.  Abdominal:     Comments: Gravid  Skin:    General: Skin is warm and dry.     Capillary Refill: Capillary refill takes less than 2 seconds.  Neurological:     General: No focal deficit present.     Mental Status: She is alert.  Psychiatric:        Mood and Affect: Mood normal.     MAU Course  Procedures: speculum exam  --OB history term SVD x 2, different partner --Reactive tracing: baseline 150, mod var, + accels, no decels --Toco: mild contractions q 7-10 minutes, resolving with IV fluid bolus and Procardia  Orders Placed This Encounter  Procedures  . Urinalysis, Routine w reflex microscopic Urine, Clean Catch    Standing Status:   Standing     Number of Occurrences:   1   Meds ordered this encounter  Medications  . lactated ringers bolus 1,000 mL  . NIFEdipine (PROCARDIA) capsule 10 mg  . cyclobenzaprine (FLEXERIL) tablet 10 mg   Patient Vitals for the past 24 hrs:  BP Temp Pulse Resp SpO2  03/24/20 1032 112/72 -- 81 -- --  03/24/20 0957 114/73 -- 86 -- --  03/24/20 0931 118/69 98.2 F (36.8 C) 94 16 --  03/24/20 0928 -- -- -- -- 97 %   Results for orders placed or performed during the hospital encounter of 03/24/20 (from the past 24 hour(s))  Urinalysis, Routine w reflex microscopic Urine, Clean Catch     Status: Abnormal   Collection Time: 03/24/20  9:32 AM  Result Value Ref Range   Color, Urine STRAW (A) YELLOW   APPearance CLEAR CLEAR   Specific Gravity, Urine 1.004 (L) 1.005 - 1.030   pH 6.0 5.0 - 8.0   Glucose, UA 50 (A) NEGATIVE mg/dL   Hgb urine dipstick NEGATIVE NEGATIVE   Bilirubin Urine NEGATIVE NEGATIVE   Ketones, ur NEGATIVE NEGATIVE mg/dL   Protein, ur NEGATIVE NEGATIVE mg/dL   Nitrite NEGATIVE NEGATIVE   Leukocytes,Ua NEGATIVE NEGATIVE   Meds ordered this encounter  Medications  . cyclobenzaprine (FLEXERIL) 10 MG tablet    Sig: Take 1 tablet (10 mg total) by mouth 2 (two) times daily as needed for muscle spasms.    Dispense:  20 tablet    Refill:  0    Order Specific Question:   Supervising Provider    Answer:   ANYANWU, UGONNA A [[id number]]   Assessment and Plan  --29 y.o. G3P2002 at [redacted]w[redacted]d  --Reactive tracing --Cervix remains closed three hours after initial exam --Patient denies pain at time of discharge --Discharge home in stable condition with labor precautions   Samantha C Weinhold, CNM 03/24/2020, 3:01 PM  

## 2020-03-26 ENCOUNTER — Inpatient Hospital Stay (HOSPITAL_BASED_OUTPATIENT_CLINIC_OR_DEPARTMENT_OTHER): Payer: BLUE CROSS/BLUE SHIELD

## 2020-03-26 ENCOUNTER — Telehealth: Payer: Self-pay | Admitting: *Deleted

## 2020-03-26 ENCOUNTER — Other Ambulatory Visit: Payer: Self-pay

## 2020-03-26 ENCOUNTER — Encounter (HOSPITAL_COMMUNITY): Payer: Self-pay | Admitting: Obstetrics and Gynecology

## 2020-03-26 ENCOUNTER — Inpatient Hospital Stay (HOSPITAL_COMMUNITY)
Admission: AD | Admit: 2020-03-26 | Discharge: 2020-03-26 | Disposition: A | Discharge status: Home / Self Care | Payer: BLUE CROSS/BLUE SHIELD | Attending: Obstetrics and Gynecology | Admitting: Obstetrics and Gynecology

## 2020-03-26 DIAGNOSIS — O289 Unspecified abnormal findings on antenatal screening of mother: Secondary | ICD-10-CM

## 2020-03-26 DIAGNOSIS — O368190 Decreased fetal movements, unspecified trimester, not applicable or unspecified: Secondary | ICD-10-CM

## 2020-03-26 DIAGNOSIS — Z3A34 34 weeks gestation of pregnancy: Secondary | ICD-10-CM

## 2020-03-26 DIAGNOSIS — O368130 Decreased fetal movements, third trimester, not applicable or unspecified: Secondary | ICD-10-CM

## 2020-03-26 DIAGNOSIS — O288 Other abnormal findings on antenatal screening of mother: Secondary | ICD-10-CM

## 2020-03-26 DIAGNOSIS — O4703 False labor before 37 completed weeks of gestation, third trimester: Secondary | ICD-10-CM

## 2020-03-26 DIAGNOSIS — O99810 Abnormal glucose complicating pregnancy: Secondary | ICD-10-CM

## 2020-03-26 LAB — URINALYSIS, ROUTINE W REFLEX MICROSCOPIC
Bilirubin Urine: NEGATIVE
Glucose, UA: >=500 mg/dL — AB
Hgb urine dipstick: NEGATIVE
Ketones, ur: NEGATIVE mg/dL
Leukocytes,Ua: NEGATIVE
Nitrite: NEGATIVE
Protein, ur: NEGATIVE mg/dL
Specific Gravity, Urine: 1.005 (ref 1.005–1.030)
pH: 6 (ref 5.0–8.0)

## 2020-03-26 LAB — WET PREP, GENITAL
Clue Cells Wet Prep HPF POC: NONE SEEN
Sperm: NONE SEEN
Trich, Wet Prep: NONE SEEN
Yeast Wet Prep HPF POC: NONE SEEN

## 2020-03-26 LAB — GLUCOSE, CAPILLARY: Glucose-Capillary: 187 mg/dL — ABNORMAL HIGH (ref 70–99)

## 2020-03-26 MED ORDER — ACCU-CHEK NANO SMARTVIEW W/DEVICE KIT: 1.0000 | PACK | 0 refills | Status: DC

## 2020-03-26 MED ORDER — ACCU-CHEK SOFTCLIX LANCETS MISC
1.0000 | Freq: Four times a day (QID) | 12 refills | Status: DC
Start: 2020-03-26 — End: 2020-04-19

## 2020-03-26 MED ORDER — ACCU-CHEK SMARTVIEW VI STRP: ORAL_STRIP | 12 refills | Status: DC

## 2020-03-26 MED ORDER — BETAMETHASONE SOD PHOS & ACET 6 (3-3) MG/ML IJ SUSP
12.0000 mg | Freq: Once | INTRAMUSCULAR | Status: AC
  Administered 2020-03-26: 12 mg via INTRAMUSCULAR
  Filled 2020-03-26: qty 5

## 2020-03-26 NOTE — Discharge Instructions (Signed)
Gestational Diabetes Mellitus, Self Care  Caring for yourself after you have been diagnosed with gestational diabetes (gestational diabetes mellitus) means keeping your blood sugar (glucose) under control. You can do that with a balance of:   Nutrition.   Exercise.   Lifestyle changes.   Medicines or insulin, if necessary.   Support from your team of health care providers and others.  The following information explains what you need to know to manage your gestational diabetes at home.  What are the risks?  If gestational diabetes is treated, it is unlikely to cause problems. If it is not controlled with treatment, it may cause problems during labor and delivery, and some of those problems can be harmful to the unborn baby (fetus) and the mother. Uncontrolled gestational diabetes may also cause the newborn baby to have breathing problems and low blood glucose.  Women who get gestational diabetes are more likely to develop it if they get pregnant again, and they are more likely to develop type 2 diabetes in the future.  How to monitor blood glucose     Check your blood glucose every day during your pregnancy. Do this as often as told by your health care provider.   Contact your health care provider if your blood glucose is above your target for two tests in a row.  Your health care provider will set individualized treatment goals for you. Generally, the goal of treatment is to maintain the following blood glucose levels during pregnancy:   Before meals (preprandial): at or below 95 mg/dL (5.3 mmol/L).   After meals (postprandial):  ? One hour after a meal: at or below 140 mg/dL (7.8 mmol/L).  ? Two hours after a meal: at or below 120 mg/dL (6.7 mmol/L).   A1c (hemoglobin A1c) level: 6-6.5%.  How to manage hyperglycemia and hypoglycemia  Hyperglycemia symptoms  Hyperglycemia, also called high blood glucose, occurs when blood glucose is too high. Make sure you know the early signs of hyperglycemia, such  as:   Increased thirst.   Hunger.   Feeling very tired.   Needing to urinate more often than usual.   Blurry vision.  Hypoglycemia symptoms  Hypoglycemia, also called low blood glucose, occurs with a blood glucose level at or below 70 mg/dL (3.9 mmol/L). The risk for hypoglycemia increases during or after exercise, during sleep, during illness, and when skipping meals or not eating for a long time (fasting). Symptoms may include:   Hunger.   Anxiety.   Sweating and feeling clammy.   Confusion.   Dizziness or feeling light-headed.   Sleepiness.   Nausea.   Increased heart rate.   Headache.   Blurry vision.   Irritability.   Tingling or numbness around the mouth, lips, or tongue.   A change in coordination.   Restless sleep.   Fainting.   Seizure.  It is important to know the symptoms of hypoglycemia and treat it right away. Always have a 15-gram rapid-acting carbohydrate snack with you to treat low blood glucose. Family members and close friends should also know the symptoms and should understand how to treat hypoglycemia, in case you are not able to treat yourself.  Treating hypoglycemia  If you are alert and able to swallow safely, follow the 15:15 rule:   Take 15 grams of a rapid-acting carbohydrate. Talk with your health care provider about how much you should take.   Rapid-acting options include:  ? Glucose pills (take 15 grams).  ? 6-8 pieces of hard candy.  ?   mmol/L), take 15 grams of a carbohydrate again.  If your blood glucose level does not increase above 70 mg/dL (3.9 mmol/L) after 3 tries, seek emergency medical care.  After your blood glucose level returns to normal, eat a meal or a snack within 1 hour. Treating  severe hypoglycemia Severe hypoglycemia is when your blood glucose level is at or below 54 mg/dL (3 mmol/L). Severe hypoglycemia is an emergency. Do not wait to see if the symptoms will go away. Get medical help right away. Call your local emergency services (911 in the [place] If you have severe hypoglycemia and you cannot eat or drink, you may need an injection of glucagon. A family member or close friend should learn how to check your blood glucose and how to give you a glucagon injection. Ask your health care provider if you need to have an emergency glucagon injection kit available. Severe hypoglycemia may need to be treated in a hospital. The treatment may include getting glucose through an IV. You may also need treatment for the cause of your hypoglycemia. Follow these instructions at home: Take diabetes medicines as told  If your health care provider prescribed insulin or diabetes medicines, take them every day.  Do not run out of insulin or other diabetes medicines that you take. Plan ahead so you always have these available.  If you use insulin, adjust your dosage based on how physically active you are and what foods you eat. Your health care provider will tell you how to adjust your dosage. Make healthy food choices  The things that you eat and drink affect your blood glucose (and your insulin dose, if this applies). Making good choices helps to control your diabetes and prevent other health problems. A healthy meal plan includes eating lean proteins, complex carbohydrates, fresh fruits and vegetables, low-fat dairy products, and healthy fats. Make an appointment to see a diet and nutrition specialist (registered dietitian) to help you create an eating plan that is right for you. Make sure that you:  Follow instructions from your health care provider about eating or drinking restrictions.  Drink enough fluid to keep your urine pale yellow.  Eat healthy snacks between nutritious  meals.  Keep a record of the carbohydrates that you eat. Do this by reading food labels and learning the standard serving sizes of foods.  Follow your sick day plan whenever you cannot eat or drink as usual. Make this plan in advance with your health care provider.  Stay active  Do 30 or more minutes of physical activity a day, or as much physical activity as your health care provider recommends during your pregnancy. ? Doing 10 minutes of exercise starting 30 minutes after each meal may help to control postprandial blood glucose levels.  If you start a new exercise or activity, work with your health care provider to adjust your insulin, medicines, or food intake as needed. Make healthy lifestyle choices  Do not drink alcohol.  Do not use any tobacco products, such as cigarettes, chewing tobacco, and e-cigarettes. If you need help quitting, ask your health care provider.  Learn to manage stress. If you need help with this, ask your health care provider. Care for your body  Keep your immunizations up to date.  Brush your teeth and gums two times a day, and floss one or more times a day. Visit your dentist one or more times every 6 months.  Maintain a healthy weight during your pregnancy. General instructions  Take over-the-counter and  prescription medicines only as told by your health care provider.  Talk with your health care provider about your risk for high blood pressure during pregnancy (preeclampsia or eclampsia).  Share your diabetes management plan with people in your workplace, school, and household.  Check your urine for ketones during your pregnancy when you are ill and as told by your health care provider.  Carry a medical alert card or wear medical alert jewelry that says you have gestational diabetes.  Keep all follow-up visits during your pregnancy (prenatal) and after delivery (postnatal) as told by your health care provider. This is important. Get the care that  you need after delivery  Have your blood glucose level checked 4-12 weeks after delivery. This is done with an oral glucose tolerance test (OGTT).  Get screened for diabetes at least every 3 years, or as often as told by your health care provider. Questions to ask your health care provider  Do I need to meet with a diabetes educator?  Where can I find a support group for people with gestational diabetes? Where to find more information For more information about gestational diabetes, visit:  American Diabetes Association (ADA): www.diabetes.org  Centers for Disease Control and Prevention (CDC): http://www.wolf.info/ Summary  Check your blood glucose every day during your pregnancy. Do this as often as told by your health care provider.  If your health care provider prescribed insulin or diabetes medicines, take them every day as told.  Keep all follow-up visits during your pregnancy (prenatal) and after delivery (postnatal) as told by your health care provider. This is important.  Have your blood glucose level checked 4-12 weeks after delivery. This information is not intended to replace advice given to you by your health care provider. Make sure you discuss any questions you have with your health care provider. Document Revised: 01/24/2018 Document Reviewed: 09/06/2015 Elsevier Patient Education  [place]. Hyperglycemia Hyperglycemia occurs when the level of sugar (glucose) in the blood is too high. Glucose is a type of sugar that provides the body's main source of energy. Certain hormones (insulin and glucagon) control the level of glucose in the blood. Insulin lowers blood glucose, and glucagon increases blood glucose. Hyperglycemia can result from having too little insulin in the bloodstream, or from the body not responding normally to insulin. Hyperglycemia occurs most often in people who have diabetes (diabetes mellitus), but it can happen in people who do not have diabetes. It can  develop quickly, and it can be life-threatening if it causes you to become severely dehydrated (diabetic ketoacidosis or hyperglycemic hyperosmolar state). Severe hyperglycemia is a medical emergency. What are the causes? If you have diabetes, hyperglycemia may be caused by:  Diabetes medicine.  Medicines that increase blood glucose or affect your diabetes control.  Not eating enough, or not eating often enough.  Changes in physical activity level.  Being sick or having an infection. If you have prediabetes or undiagnosed diabetes:  Hyperglycemia may be caused by those conditions. If you do not have diabetes, hyperglycemia may be caused by:  Certain medicines, including steroid medicines, beta-blockers, epinephrine, and thiazide diuretics.  Stress.  Serious illness.  Surgery.  Diseases of the pancreas.  Infection. What increases the risk? Hyperglycemia is more likely to develop in people who have risk factors for diabetes, such as:  Having a family member with diabetes.  Having a gene for type 1 diabetes that is passed from parent to child (inherited).  Living in an area with cold weather  conditions.  Exposure to certain viruses.  Certain conditions in which the body's disease-fighting (immune) system attacks itself (autoimmune disorders).  Being overweight or obese.  Having an inactive (sedentary) lifestyle.  Having been diagnosed with insulin resistance.  Having a history of prediabetes, gestational diabetes, or polycystic ovarian syndrome (PCOS).  Being of American-Indian, African-American, Hispanic/Latino, or Asian/Pacific Islander descent. What are the signs or symptoms? Hyperglycemia may not cause any symptoms. If you do have symptoms, they may include early warning signs, such as:  Increased thirst.  Hunger.  Feeling very tired.  Needing to urinate more often than usual.  Blurry vision. Other symptoms may develop if hyperglycemia gets worse, such  as:  Dry mouth.  Loss of appetite.  Fruity-smelling breath.  Weakness.  Unexpected or rapid weight gain or weight loss.  Tingling or numbness in the hands or feet.  Headache.  Skin that does not quickly return to normal after being lightly pinched and released (poor skin turgor).  Abdominal pain.  Cuts or bruises that are slow to heal. How is this diagnosed? Hyperglycemia is diagnosed with a blood test to measure your blood glucose level. This blood test is usually done while you are having symptoms. Your health care provider may also do a physical exam and review your medical history. You may have more tests to determine the cause of your hyperglycemia, such as:  A fasting blood glucose (FBG) test. You will not be allowed to eat (you will fast) for at least 8 hours before a blood sample is taken.  An A1c (hemoglobin A1c) blood test. This provides information about blood glucose control over the previous 2-3 months.  An oral glucose tolerance test (OGTT). This measures your blood glucose at two times: ? After fasting. This is your baseline blood glucose level. ? Two hours after drinking a beverage that contains glucose. How is this treated? Treatment depends on the cause of your hyperglycemia. Treatment may include:  Taking medicine to regulate your blood glucose levels. If you take insulin or other diabetes medicines, your medicine or dosage may be adjusted.  Lifestyle changes, such as exercising more, eating healthier foods, or losing weight.  Treating an illness or infection, if this caused your hyperglycemia.  Checking your blood glucose more often.  Stopping or reducing steroid medicines, if these caused your hyperglycemia. If your hyperglycemia becomes severe and it results in hyperglycemic hyperosmolar state, you must be hospitalized and given IV fluids. Follow these instructions at home:  General instructions  Take over-the-counter and prescription medicines  only as told by your health care provider.  Do not use any products that contain nicotine or tobacco, such as cigarettes and e-cigarettes. If you need help quitting, ask your health care provider.  Limit alcohol intake to no more than 1 drink per day for nonpregnant women and 2 drinks per day for men. One drink equals 12 oz of beer, 5 oz of wine, or 1 oz of hard liquor.  Learn to manage stress. If you need help with this, ask your health care provider.  Keep all follow-up visits as told by your health care provider. This is important. Eating and drinking   Maintain a healthy weight.  Exercise regularly, as directed by your health care provider.  Stay hydrated, especially when you exercise, get sick, or spend time in hot temperatures.  Eat healthy foods, such as: ? Lean proteins. ? Complex carbohydrates. ? Fresh fruits and vegetables. ? Low-fat dairy products. ? Healthy fats.  Drink enough fluid  to keep your urine clear or pale yellow. If you have diabetes:  Make sure you know the symptoms of hyperglycemia.  Follow your diabetes management plan, as told by your health care provider. Make sure you: ? Take your insulin and medicines as directed. ? Follow your exercise plan. ? Follow your meal plan. Eat on time, and do not skip meals. ? Check your blood glucose as often as directed. Make sure to check your blood glucose before and after exercise. If you exercise longer or in a different way than usual, check your blood glucose more often. ? Follow your sick day plan whenever you cannot eat or drink normally. Make this plan in advance with your health care provider.  Share your diabetes management plan with people in your workplace, school, and household.  Check your urine for ketones when you are ill and as told by your health care provider.  Carry a medical alert card or wear medical alert jewelry. Contact a health care provider if:  Your blood glucose is at or above 240  mg/dL (13.3 mmol/L) for 2 days in a row.  You have problems keeping your blood glucose in your target range.  You have frequent episodes of hyperglycemia. Get help right away if:  You have difficulty breathing.  You have a change in how you think, feel, or act (mental status).  You have nausea or vomiting that does not go away. These symptoms may represent a serious problem that is an emergency. Do not wait to see if the symptoms will go away. Get medical help right away. Call your local emergency services (911 in the [place] Do not drive yourself to the hospital. Summary  Hyperglycemia occurs when the level of sugar (glucose) in the blood is too high.  Hyperglycemia is diagnosed with a blood test to measure your blood glucose level. This blood test is usually done while you are having symptoms. Your health care provider may also do a physical exam and review your medical history.  If you have diabetes, follow your diabetes management plan as told by your health care provider.  Contact your health care provider if you have problems keeping your blood glucose in your target range. This information is not intended to replace advice given to you by your health care provider. Make sure you discuss any questions you have with your health care provider. Document Revised: 04/20/2016 Document Reviewed: 04/20/2016 Elsevier Patient Education  [place].

## 2020-03-26 NOTE — Telephone Encounter (Signed)
Left voice message for patient to go to MAU today per Raelyn Mora, CNM. If patient need to follow up in clinic tomorrow. [facility] will reach out to clinic.  Clovis Pu, RN   Patient returned nurse call. Advised patient to go to MAU per [place] instructions. Patient voiced understanding. She also was keeping track of kick counts and contractions. Advised patient to take that information with her.  Clovis Pu, RN

## 2020-03-26 NOTE — MAU Provider Note (Signed)
History     CSN: [id number]  Arrival date and time: 03/26/20 1538   First Provider Initiated Contact with Patient 03/26/20 1619      No chief complaint on file.  Tanya Mccullough is a 29 y.o. G3P2002 at 79w0dwho [facility]  She presents today with complaint of contractions.  She states she has been having contractions since Saturday and was worked up fully at the MAU.  She reports she continues to have contractions today every 8-9 minutes and endorses that this not a change from Saturday. She reports that she is experiencing fetal movement, but "he is an active baby and is moving different." Patient expresses concern about her contraction frequency and fetal movement.  Patient endorses movement since arrival and reports hitting the tracker 6x since being placed on the monitor.  Patient reports the contractions feel like 'hardening' and reports it is located in her lower abdominal area. Patient rates the contractions a 5/10 and is able to talk through them.  Patient states "it is just more of an aggravating tightening."  Patient reports sexual activity on Saturday morning.    OB History    Gravida  3   Para  2   Term  2   Preterm  0   AB  0   Living  2     SAB  0   TAB  0   Ectopic  0   Multiple  0   Live Births  2           Past Medical History:  Diagnosis Date  . Migraines   . Vaginal Pap smear, abnormal     Past Surgical History:  Procedure Laterality Date  . CHOLECYSTECTOMY    . GALLBLADDER SURGERY    . TONSILECTOMY, ADENOIDECTOMY, BILATERAL MYRINGOTOMY AND TUBES      Family History  Problem Relation Age of Onset  . Healthy Mother   . Other Father        murdered  . Diabetes Maternal Grandmother   . Hypertension Maternal Grandmother   . Migraines Cousin   . ADD / ADHD Son   . Asthma Son   . Cancer Maternal Aunt   . Hypertension Maternal Aunt     Social History   Tobacco Use  . Smoking status: Never Smoker  .  Smokeless tobacco: Never Used  Vaping Use  . Vaping Use: Never used  Substance Use Topics  . Alcohol use: Not Currently    Comment: 1 glass every days  . Drug use: Never    Allergies:  Allergies  Allergen Reactions  . Prochlorperazine Other (See Comments)    Lip twitching    Medications Prior to Admission  Medication Sig Dispense Refill Last Dose  . Blood Pressure Monitoring (BLOOD PRESSURE KIT) DEVI 1 kit by Does not apply route once a week. Check Blood Pressure regularly and record readings into the Babyscripts App.  Large Cuff.  DX O90.0 1 each 0 Past Week at Unknown time  . cyclobenzaprine (FLEXERIL) 10 MG tablet Take 1 tablet (10 mg total) by mouth 2 (two) times daily as needed for muscle spasms. 20 tablet 0 Past Week at Unknown time  . Prenatal MV-Min-FA-Omega-3 (PRENATAL GUMMIES/DHA & FA) 0.4-32.5 MG CHEW Chew 2 each by mouth daily. 1-2 gummy by mouth daily. 60 tablet 12 03/26/2020 at Unknown time  . Misc. Devices (GOJJI WEIGHT SCALE) MISC 1 Device by Does not apply route daily as needed. To weight self daily as needed  at home. ICD-10 code: Z34.90 1 each 0   . Misc. Devices (MEDELA DOUBLE BREAST PUMP) MISC 1 Pump by Does not apply route as needed. 1 each 0     Review of Systems  Constitutional: Negative for chills and fever.  Respiratory: Negative for cough and shortness of breath.   Gastrointestinal: Positive for abdominal pain (Hardening with contractions). Negative for constipation, diarrhea, nausea and vomiting.  Genitourinary: Positive for pelvic pain ("Breaking" particularly with movement and leg separation. ). Negative for difficulty urinating, dyspareunia, dysuria, vaginal bleeding and vaginal discharge.  Neurological: Negative for dizziness, light-headedness and headaches.   Physical Exam   Blood pressure 120/74, pulse 88, temperature 98.7 F (37.1 C), temperature source Oral, resp. rate 20, weight 85.9 kg, last menstrual period 08/01/2019, SpO2 98 %.  Physical  Exam Vitals and nursing note reviewed.  Constitutional:      General: She is not in acute distress.    Appearance: Normal appearance.  HENT:     Head: Normocephalic and atraumatic.  Eyes:     Conjunctiva/sclera: Conjunctivae normal.  Cardiovascular:     Rate and Rhythm: Normal rate and regular rhythm.  Pulmonary:     Effort: Pulmonary effort is normal. No respiratory distress.     Breath sounds: Normal breath sounds.  Abdominal:     Tenderness: There is no abdominal tenderness.     Comments: Gravid--fundal height 34cm, Soft, NT, Ctx palpate mild to moderate   Genitourinary:    Comments: Speculum Exam: -Normal External Genitalia: Non tender, no apparent discharge at introitus.  -Vaginal Vault: Pink mucosa with good rugae. Scant amt thick white discharge -wet prep collected -Cervix:Pink, no lesions, cysts, or polyps.  Appears open. No active bleeding, but thick yellow mucoid discharge noted from os-GC/CT collected -Bimanual Exam: Dilation: 1.5 Effacement (%): Thick Station: Ballotable Presentation: Vertex Exam by:: Gavin Pound, CNM  Musculoskeletal:        General: Normal range of motion.     Cervical back: Normal range of motion.  Skin:    General: Skin is warm and dry.  Neurological:     Mental Status: She is alert and oriented to person, place, and time.  Psychiatric:        Mood and Affect: Mood normal.        Behavior: Behavior normal.        Thought Content: Thought content normal.      Fetal Assessment 165 bpm, Mod Var, -Decels, +Accels Toco: Irregular Ctx-4 graphed in 16 minutes  MAU Course   Results for orders placed or performed during the hospital encounter of 03/26/20 (from the past 24 hour(s))  Urinalysis, Routine w reflex microscopic Urine, Clean Catch     Status: Abnormal   Collection Time: 03/26/20  3:48 PM  Result Value Ref Range   Color, Urine STRAW (A) YELLOW   APPearance CLEAR CLEAR   Specific Gravity, Urine 1.005 1.005 - 1.030   pH 6.0 5.0 -  8.0   Glucose, UA >=500 (A) NEGATIVE mg/dL   Hgb urine dipstick NEGATIVE NEGATIVE   Bilirubin Urine NEGATIVE NEGATIVE   Ketones, ur NEGATIVE NEGATIVE mg/dL   Protein, ur NEGATIVE NEGATIVE mg/dL   Nitrite NEGATIVE NEGATIVE   Leukocytes,Ua NEGATIVE NEGATIVE   RBC / HPF 0-5 0 - 5 RBC/hpf   WBC, UA 0-5 0 - 5 WBC/hpf   Bacteria, UA FEW (A) NONE SEEN   Squamous Epithelial / LPF 0-5 0 - 5   Mucus PRESENT   Wet prep, genital  Status: Abnormal   Collection Time: 03/26/20  4:29 PM  Result Value Ref Range   Yeast Wet Prep HPF POC NONE SEEN NONE SEEN   Trich, Wet Prep NONE SEEN NONE SEEN   Clue Cells Wet Prep HPF POC NONE SEEN NONE SEEN   WBC, Wet Prep HPF POC MODERATE (A) NONE SEEN   Sperm NONE SEEN   Glucose, capillary     Status: Abnormal   Collection Time: 03/26/20  5:49 PM  Result Value Ref Range   Glucose-Capillary 187 (H) 70 - 99 mg/dL       MDM Pelvic exam with cultures Labs: UA, CBG EFM Ultrasound Assessment and Plan  29 year old G3P2002  SIUP at 42 weeks Cat I FT DFM Preterm Contractions  -Exam performed and findings discussed. -Cultures collected and pending.  -Discussed potential usage of Terbutaline for cessation of contractions if necessary. R/B reviewed. -Patient offered and declines pain medication. -Informed that NST not reactive and will send for BPP. -Previous provider, from Saturday, on site and reports patient cervix was closed. -Will discuss initiation of BMZ -Patient to [place].   Maryann Conners MSN, CNM 03/26/2020, 4:19 PM   Reassessment (5:31 PM) BPP 8/8  -Provider to bedside and results discussed. -Patient reports one contraction while receiving [place], but none since. -Extensive discussion regarding change in last cervical exam and recommendation for BMZ. -Patient agreeable and without concerns. Questions follow-up. -Instructed to keep appt as scheduled for tomorrow.  Plan for pelvic rest until 36 weeks. -Will continue to monitor contraction  pattern and reassess.  -Patient UA reviewed and with >500 glucose. -Will collect CBG  Reassessment (5:57 PM)  -CBG 187. Patient has been NPO since arrival. -Dr. Nehemiah Settle consulted and informed of patient status and results.  Advised: *Have patient monitor sugars x 2 weeks. -Orders placed.  -Patient informed of results and POC. -Reviewed taking of BS and information sheet given. -Cervical exam performed and patient remains the same. Cervix feels more posterior and patient expresses more discomfort during this exam! -Patient unsure if contractions have increased, decreased, or remained the same. -Toco shows ctx Irregular with irritability. -Patient states she is able to talk through contractions and continues to decline pain medication.  -Informed that no tocolytics would be given since patient not experiencing pain and contractions are irregular and appear to be <6 hr. -Patient agreeable. -Instructed to return to MAU if contractions become more regular, cause discomfort, decreased fetal movement, or other s/s of labor. -Patient to return to MAU tomorrow at 1730 for repeat BMZ dosing.  -Discharged to home in stable condition.  Maryann Conners MSN, CNM Advanced Practice Provider, [facility]

## 2020-03-26 NOTE — MAU Note (Signed)
Pt reports being seen here on Sunday for the same thing.   Pt reports braxton hicks ctx's.   Denies vaginal bleeding or LOF.

## 2020-03-26 NOTE — Telephone Encounter (Signed)
Thank you! I hope she goes for monitoring. Please call MAU provider at [phone] and MAU charge RN at [phone] to notify them of patient's complaints and that you advise her to go there for preterm labor (PTL) evaluation.

## 2020-03-26 NOTE — Telephone Encounter (Signed)
If this patient s having contractions and DFM, she does NOT need to wait until tomorrow to be seen. I know she may not have been pleased with her visit there, but it is what is best for her and the baby at this time. If she chooses not to go, that is her choice. BUT we have to advise her only to go to MAU for her current complaints.

## 2020-03-26 NOTE — Telephone Encounter (Addendum)
Patient called reporting decrease fetal movement. She is having contractions every 10 minutes or so. She went to the hospital on Sunday. The decrease fetal movement after the hospital visit. Patient stated the baby is usually very active. She was not pleased with the assessment at MAU. Patient requested appointment for in office visit. Advised patient that today, there is no provider in the office, just nurse visits. Patient requested next able appointment with Raelyn Mora, CNM. Advised patient to record and document fetal kick counts and contractions. Patient stated she does know how to count the kick counts. If patient does not feel any movement today , patient to return to MAU. Advised to keep hydrated. Appointment tomorrow 03/27/20 at 1 PM with Rolitta.  Clovis Pu, RN

## 2020-03-27 ENCOUNTER — Encounter: Payer: Self-pay | Admitting: Obstetrics and Gynecology

## 2020-03-27 ENCOUNTER — Ambulatory Visit (INDEPENDENT_AMBULATORY_CARE_PROVIDER_SITE_OTHER): Payer: BLUE CROSS/BLUE SHIELD | Admitting: Obstetrics and Gynecology

## 2020-03-27 VITALS — BP 102/72 | HR 104 | Temp 98.0°F | Wt 189.0 lb

## 2020-03-27 DIAGNOSIS — O4703 False labor before 37 completed weeks of gestation, third trimester: Secondary | ICD-10-CM

## 2020-03-27 DIAGNOSIS — Z3A34 34 weeks gestation of pregnancy: Secondary | ICD-10-CM

## 2020-03-27 DIAGNOSIS — Z3480 Encounter for supervision of other normal pregnancy, unspecified trimester: Secondary | ICD-10-CM

## 2020-03-27 LAB — URINE CULTURE: Culture: <10000 — AB

## 2020-03-27 LAB — GC/CHLAMYDIA PROBE AMP (~~LOC~~) NOT AT ARMC
Chlamydia: NEGATIVE
Comment: NEGATIVE
Comment: NORMAL
Neisseria Gonorrhea: NEGATIVE

## 2020-03-27 MED ORDER — BETAMETHASONE SOD PHOS & ACET 6 (3-3) MG/ML IJ SUSP
12.0000 mg | Freq: Once | INTRAMUSCULAR | Status: AC
  Administered 2020-03-27: 12 mg via INTRAMUSCULAR

## 2020-03-27 NOTE — Progress Notes (Signed)
LOW-RISK PREGNANCY OFFICE VISIT Patient name: Tanya Mccullough MRN [id number]  Date of birth: May 08, 1991 Chief Complaint:   Routine Prenatal Visit  History of Present Illness:   Tanya Mccullough is a 29 y.o. G61P2002 female at [redacted]w[redacted]d with an Estimated Date of Delivery: 05/07/20 being seen today for ongoing management of a low-risk pregnancy.  Today she reports occasional contractions every 8-9 minutes since Saturday 03/23/2020. She was seen in MAU yesterday for contractions and DFM. She was dilated 1.5 cm per provider's note and BPP was 8/8. Was advised to chceck BS x 2 wks d/t elevated random CBG in MAU. Reports she is checking them and recording them in babyRx app.  Contractions: Irregular. Vag. Bleeding: None.  Movement: Present. denies leaking of fluid. Review of Systems:   Pertinent items are noted in HPI Denies abnormal vaginal discharge w/ itching/odor/irritation, headaches, visual changes, shortness of breath, chest pain, abdominal pain, severe nausea/vomiting, or problems with urination or bowel movements unless otherwise stated above. Pertinent History Reviewed:  Reviewed past medical,surgical, social, obstetrical and family history.  Reviewed problem list, medications and allergies. Physical Assessment:   Vitals:   03/27/20 1307  BP: 102/72  Pulse: (!) 104  Temp: 98 F (36.7 C)  Weight: 189 lb (85.7 kg)  Body mass index is 29.6 kg/m.        Physical Examination:   General appearance: Well appearing, and in no distress  Mental status: Alert, oriented to person, place, and time  Skin: Warm & dry  Cardiovascular: Normal heart rate noted  Respiratory: Normal respiratory effort, no distress  Abdomen: Soft, gravid, nontender  Pelvic: Cervical exam performed  Dilation: 1 Effacement (%): 60 Station: -3  Extremities: Edema: None  Fetal Status: Fetal Heart Rate (bpm): 156 Fundal Height: 35 cm Movement: Present Presentation: Vertex  Results for orders placed or performed during the  hospital encounter of 03/26/20 (from the past 24 hour(s))  Urinalysis, Routine w reflex microscopic Urine, Clean Catch   Collection Time: 03/26/20  3:48 PM  Result Value Ref Range   Color, Urine STRAW (A) YELLOW   APPearance CLEAR CLEAR   Specific Gravity, Urine 1.005 1.005 - 1.030   pH 6.0 5.0 - 8.0   Glucose, UA >=500 (A) NEGATIVE mg/dL   Hgb urine dipstick NEGATIVE NEGATIVE   Bilirubin Urine NEGATIVE NEGATIVE   Ketones, ur NEGATIVE NEGATIVE mg/dL   Protein, ur NEGATIVE NEGATIVE mg/dL   Nitrite NEGATIVE NEGATIVE   Leukocytes,Ua NEGATIVE NEGATIVE   RBC / HPF 0-5 0 - 5 RBC/hpf   WBC, UA 0-5 0 - 5 WBC/hpf   Bacteria, UA FEW (A) NONE SEEN   Squamous Epithelial / LPF 0-5 0 - 5   Mucus PRESENT   Wet prep, genital   Collection Time: 03/26/20  4:29 PM  Result Value Ref Range   Yeast Wet Prep HPF POC NONE SEEN NONE SEEN   Trich, Wet Prep NONE SEEN NONE SEEN   Clue Cells Wet Prep HPF POC NONE SEEN NONE SEEN   WBC, Wet Prep HPF POC MODERATE (A) NONE SEEN   Sperm NONE SEEN   Glucose, capillary   Collection Time: 03/26/20  5:49 PM  Result Value Ref Range   Glucose-Capillary 187 (H) 70 - 99 mg/dL    Assessment & Plan:  1) Low-risk pregnancy G3P2002 at [redacted]w[redacted]d with an Estimated Date of Delivery: 05/07/20   2) Supervision of other normal pregnancy, antepartum - Culture, beta strep (group b only)  3) [redacted] weeks gestation of pregnancy  4)  Preterm labor in third trimester without delivery - betamethasone acetate-betamethasone sodium phosphate (CELESTONE) injection 12 mg -- injection #2  4. Preterm uterine contractions in third trimester, antepartum - Obtain GBS cx in case PTB prior to 36 wks   Meds:  Meds ordered this encounter  Medications  . betamethasone acetate-betamethasone sodium phosphate (CELESTONE) injection 12 mg   Labs/procedures today: GBS and cervical check  Plan:  Continue routine obstetrical care   Reviewed: Preterm labor symptoms and general obstetric precautions  including but not limited to vaginal bleeding, contractions, leaking of fluid and fetal movement were reviewed in detail with the patient.  All questions were answered. Has home bp cuff. Check bp weekly, let us know if >140/90.   Follow-up: Return in about 1 week (around 04/03/2020) for Return OB visit.  Orders Placed This Encounter  Procedures  . Culture, beta strep (group b only)   Raelyn Mora MSN, CNM 03/27/2020

## 2020-03-29 ENCOUNTER — Telehealth: Payer: Self-pay | Admitting: Obstetrics and Gynecology

## 2020-03-29 ENCOUNTER — Other Ambulatory Visit: Payer: Self-pay

## 2020-03-29 ENCOUNTER — Inpatient Hospital Stay (HOSPITAL_COMMUNITY)
Admission: AD | Admit: 2020-03-29 | Discharge: 2020-04-01 | DRG: 831 | Disposition: A | Discharge status: Home / Self Care | Payer: BLUE CROSS/BLUE SHIELD | Attending: Obstetrics and Gynecology | Admitting: Obstetrics and Gynecology

## 2020-03-29 DIAGNOSIS — E1110 Type 2 diabetes mellitus with ketoacidosis without coma: Secondary | ICD-10-CM | POA: Diagnosis present

## 2020-03-29 DIAGNOSIS — Z3A34 34 weeks gestation of pregnancy: Secondary | ICD-10-CM

## 2020-03-29 DIAGNOSIS — O24113 Pre-existing diabetes mellitus, type 2, in pregnancy, third trimester: Principal | ICD-10-CM | POA: Diagnosis present

## 2020-03-29 DIAGNOSIS — Z20822 Contact with and (suspected) exposure to covid-19: Secondary | ICD-10-CM | POA: Diagnosis present

## 2020-03-29 DIAGNOSIS — O99810 Abnormal glucose complicating pregnancy: Secondary | ICD-10-CM | POA: Diagnosis present

## 2020-03-29 DIAGNOSIS — O24913 Unspecified diabetes mellitus in pregnancy, third trimester: Secondary | ICD-10-CM

## 2020-03-29 NOTE — MAU Note (Signed)
Pt reports headache x 2 days, CBG at 2030 was 420. Pt was here two days ago and CBG was elevated and pt was given a meter to do checks at home. Not on any meds.

## 2020-03-29 NOTE — Telephone Encounter (Signed)
Patient called the after hours call a nurse line, is recently dx'd as diabetic,  Pt just got her glucometer, and first glucose was 457.  Pt has a sense of malaise.  Advised to present to MAU for evaluation and probable admission for diabetic regulation. Estimated Date of Delivery: 05/07/20 [redacted]w[redacted]d

## 2020-03-30 ENCOUNTER — Inpatient Hospital Stay (HOSPITAL_BASED_OUTPATIENT_CLINIC_OR_DEPARTMENT_OTHER): Payer: BLUE CROSS/BLUE SHIELD

## 2020-03-30 ENCOUNTER — Encounter (HOSPITAL_COMMUNITY): Payer: Self-pay | Admitting: Obstetrics and Gynecology

## 2020-03-30 DIAGNOSIS — Z362 Encounter for other antenatal screening follow-up: Secondary | ICD-10-CM

## 2020-03-30 DIAGNOSIS — Z794 Long term (current) use of insulin: Secondary | ICD-10-CM | POA: Diagnosis not present

## 2020-03-30 DIAGNOSIS — O24419 Gestational diabetes mellitus in pregnancy, unspecified control: Secondary | ICD-10-CM | POA: Diagnosis not present

## 2020-03-30 DIAGNOSIS — E1165 Type 2 diabetes mellitus with hyperglycemia: Secondary | ICD-10-CM | POA: Diagnosis not present

## 2020-03-30 DIAGNOSIS — O99810 Abnormal glucose complicating pregnancy: Secondary | ICD-10-CM | POA: Diagnosis present

## 2020-03-30 DIAGNOSIS — E1110 Type 2 diabetes mellitus with ketoacidosis without coma: Secondary | ICD-10-CM | POA: Diagnosis present

## 2020-03-30 DIAGNOSIS — Z20822 Contact with and (suspected) exposure to covid-19: Secondary | ICD-10-CM | POA: Diagnosis present

## 2020-03-30 DIAGNOSIS — Z3A34 34 weeks gestation of pregnancy: Secondary | ICD-10-CM

## 2020-03-30 DIAGNOSIS — O24113 Pre-existing diabetes mellitus, type 2, in pregnancy, third trimester: Secondary | ICD-10-CM | POA: Diagnosis present

## 2020-03-30 DIAGNOSIS — O24912 Unspecified diabetes mellitus in pregnancy, second trimester: Secondary | ICD-10-CM | POA: Diagnosis not present

## 2020-03-30 LAB — RAPID URINE DRUG SCREEN, HOSP PERFORMED
Amphetamines: NOT DETECTED
Barbiturates: NOT DETECTED
Benzodiazepines: NOT DETECTED
Cocaine: NOT DETECTED
Opiates: NOT DETECTED
Tetrahydrocannabinol: NOT DETECTED

## 2020-03-30 LAB — BASIC METABOLIC PANEL
Anion gap: 10 (ref 5–15)
Anion gap: 7 (ref 5–15)
Anion gap: 10 (ref 5–15)
BUN: <5 mg/dL — ABNORMAL LOW (ref 6–20)
BUN: <5 mg/dL — ABNORMAL LOW (ref 6–20)
BUN: <5 mg/dL — ABNORMAL LOW (ref 6–20)
CO2: 18 mmol/L — ABNORMAL LOW (ref 22–32)
CO2: 20 mmol/L — ABNORMAL LOW (ref 22–32)
CO2: 21 mmol/L — ABNORMAL LOW (ref 22–32)
Calcium: 8.9 mg/dL (ref 8.9–10.3)
Calcium: 8.8 mg/dL — ABNORMAL LOW (ref 8.9–10.3)
Calcium: 9.1 mg/dL (ref 8.9–10.3)
Chloride: 106 mmol/L (ref 98–111)
Chloride: 108 mmol/L (ref 98–111)
Chloride: 103 mmol/L (ref 98–111)
Creatinine, Ser: 0.68 mg/dL (ref 0.44–1.00)
Creatinine, Ser: 0.57 mg/dL (ref 0.44–1.00)
Creatinine, Ser: 0.67 mg/dL (ref 0.44–1.00)
GFR calc Af Amer: >60 mL/min (ref >60)
GFR calc Af Amer: >60 mL/min (ref >60)
GFR calc Af Amer: >60 mL/min (ref >60)
GFR calc non Af Amer: >60 mL/min (ref >60)
GFR calc non Af Amer: >60 mL/min (ref >60)
GFR calc non Af Amer: >60 mL/min (ref >60)
Glucose, Bld: 162 mg/dL — ABNORMAL HIGH (ref 70–99)
Glucose, Bld: 123 mg/dL — ABNORMAL HIGH (ref 70–99)
Glucose, Bld: 201 mg/dL — ABNORMAL HIGH (ref 70–99)
Potassium: 3.6 mmol/L (ref 3.5–5.1)
Potassium: 3.6 mmol/L (ref 3.5–5.1)
Potassium: 3.9 mmol/L (ref 3.5–5.1)
Sodium: 134 mmol/L — ABNORMAL LOW (ref 135–145)
Sodium: 135 mmol/L (ref 135–145)
Sodium: 134 mmol/L — ABNORMAL LOW (ref 135–145)

## 2020-03-30 LAB — COMPREHENSIVE METABOLIC PANEL
ALT: 19 U/L (ref 0–44)
AST: 39 U/L (ref 15–41)
Albumin: 3 g/dL — ABNORMAL LOW (ref 3.5–5.0)
Alkaline Phosphatase: 132 U/L — ABNORMAL HIGH (ref 38–126)
Anion gap: 14 (ref 5–15)
BUN: 9 mg/dL (ref 6–20)
CO2: 19 mmol/L — ABNORMAL LOW (ref 22–32)
Calcium: 9.7 mg/dL (ref 8.9–10.3)
Chloride: 99 mmol/L (ref 98–111)
Creatinine, Ser: 0.7 mg/dL (ref 0.44–1.00)
GFR calc Af Amer: >60 mL/min (ref >60)
GFR calc non Af Amer: >60 mL/min (ref >60)
Glucose, Bld: 318 mg/dL — ABNORMAL HIGH (ref 70–99)
Potassium: 4.7 mmol/L (ref 3.5–5.1)
Sodium: 132 mmol/L — ABNORMAL LOW (ref 135–145)
Total Bilirubin: 0.7 mg/dL (ref 0.3–1.2)
Total Protein: 7.2 g/dL (ref 6.5–8.1)

## 2020-03-30 LAB — TYPE AND SCREEN
ABO/RH(D): AB POS
Antibody Screen: NEGATIVE

## 2020-03-30 LAB — GLUCOSE, CAPILLARY
Glucose-Capillary: 103 mg/dL — ABNORMAL HIGH (ref 70–99)
Glucose-Capillary: 113 mg/dL — ABNORMAL HIGH (ref 70–99)
Glucose-Capillary: 116 mg/dL — ABNORMAL HIGH (ref 70–99)
Glucose-Capillary: 119 mg/dL — ABNORMAL HIGH (ref 70–99)
Glucose-Capillary: 122 mg/dL — ABNORMAL HIGH (ref 70–99)
Glucose-Capillary: 134 mg/dL — ABNORMAL HIGH (ref 70–99)
Glucose-Capillary: 139 mg/dL — ABNORMAL HIGH (ref 70–99)
Glucose-Capillary: 143 mg/dL — ABNORMAL HIGH (ref 70–99)
Glucose-Capillary: 146 mg/dL — ABNORMAL HIGH (ref 70–99)
Glucose-Capillary: 153 mg/dL — ABNORMAL HIGH (ref 70–99)
Glucose-Capillary: 160 mg/dL — ABNORMAL HIGH (ref 70–99)
Glucose-Capillary: 185 mg/dL — ABNORMAL HIGH (ref 70–99)
Glucose-Capillary: 333 mg/dL — ABNORMAL HIGH (ref 70–99)

## 2020-03-30 LAB — URINALYSIS, ROUTINE W REFLEX MICROSCOPIC
Bilirubin Urine: NEGATIVE
Glucose, UA: >=500 mg/dL — AB
Hgb urine dipstick: NEGATIVE
Ketones, ur: 20 mg/dL — AB
Leukocytes,Ua: NEGATIVE
Nitrite: NEGATIVE
Protein, ur: NEGATIVE mg/dL
Specific Gravity, Urine: 1.02 (ref 1.005–1.030)
pH: 6 (ref 5.0–8.0)

## 2020-03-30 LAB — BETA-HYDROXYBUTYRIC ACID: Beta-Hydroxybutyric Acid: 1.31 mmol/L — ABNORMAL HIGH (ref 0.05–0.27)

## 2020-03-30 LAB — HEMOGLOBIN A1C
Hgb A1c MFr Bld: 7.4 % — ABNORMAL HIGH (ref 4.8–5.6)
Mean Plasma Glucose: 165.68 mg/dL

## 2020-03-30 LAB — TSH: TSH: 1.581 u[IU]/mL (ref 0.350–4.500)

## 2020-03-30 LAB — SARS CORONAVIRUS 2 BY RT PCR (HOSPITAL ORDER, PERFORMED IN ~~LOC~~ HOSPITAL LAB): SARS Coronavirus 2: NEGATIVE

## 2020-03-30 MED ORDER — INSULIN NPH (HUMAN) (ISOPHANE) 100 UNIT/ML ~~LOC~~ SUSP
14.0000 [IU] | Freq: Two times a day (BID) | SUBCUTANEOUS | Status: DC
  Administered 2020-03-30 – 2020-04-01 (×4): 14 [IU] via SUBCUTANEOUS
  Filled 2020-03-30: qty 10

## 2020-03-30 MED ORDER — INSULIN ASPART 100 UNIT/ML ~~LOC~~ SOLN
5.0000 [IU] | Freq: Three times a day (TID) | SUBCUTANEOUS | Status: DC
  Administered 2020-03-30 – 2020-04-01 (×6): 5 [IU] via SUBCUTANEOUS

## 2020-03-30 MED ORDER — INSULIN NPH (HUMAN) (ISOPHANE) 100 UNIT/ML ~~LOC~~ SUSP
14.0000 [IU] | Freq: Once | SUBCUTANEOUS | Status: AC
  Administered 2020-03-30: 14 [IU] via SUBCUTANEOUS
  Filled 2020-03-30: qty 10

## 2020-03-30 MED ORDER — DEXTROSE IN LACTATED RINGERS 5 % IV SOLN: INTRAVENOUS | Status: DC

## 2020-03-30 MED ORDER — INSULIN ASPART 100 UNIT/ML ~~LOC~~ SOLN
0.0000 [IU] | Freq: Three times a day (TID) | SUBCUTANEOUS | Status: DC
  Administered 2020-03-30: 3 [IU] via SUBCUTANEOUS
  Administered 2020-03-31: 2 [IU] via SUBCUTANEOUS
  Administered 2020-03-31: 3 [IU] via SUBCUTANEOUS

## 2020-03-30 MED ORDER — DEXTROSE-NACL 5-0.9 % IV SOLN: INTRAVENOUS | Status: DC

## 2020-03-30 MED ORDER — ENOXAPARIN SODIUM 40 MG/0.4ML ~~LOC~~ SOLN
40.0000 mg | SUBCUTANEOUS | Status: DC
  Administered 2020-03-30: 40 mg via SUBCUTANEOUS
  Filled 2020-03-30 (×2): qty 0.4

## 2020-03-30 MED ORDER — SODIUM CHLORIDE 0.9 % IV BOLUS
3000.0000 mL | Freq: Once | INTRAVENOUS | Status: AC
  Administered 2020-03-30 (×3): 1000 mL via INTRAVENOUS

## 2020-03-30 MED ORDER — CALCIUM CARBONATE ANTACID 500 MG PO CHEW: 2.0000 | CHEWABLE_TABLET | ORAL | Status: DC | PRN

## 2020-03-30 MED ORDER — ZOLPIDEM TARTRATE 5 MG PO TABS: 5.0000 mg | ORAL_TABLET | Freq: Every evening | ORAL | Status: DC | PRN

## 2020-03-30 MED ORDER — LACTATED RINGERS IV SOLN: INTRAVENOUS | Status: DC

## 2020-03-30 MED ORDER — ACETAMINOPHEN 325 MG PO TABS
650.0000 mg | ORAL_TABLET | ORAL | Status: DC | PRN
  Administered 2020-03-30 – 2020-04-01 (×5): 650 mg via ORAL
  Filled 2020-03-30 (×5): qty 2

## 2020-03-30 MED ORDER — PRENATAL MULTIVITAMIN CH
1.0000 | ORAL_TABLET | Freq: Every day | ORAL | Status: DC
  Administered 2020-04-01: 1 via ORAL
  Filled 2020-03-30 (×2): qty 1

## 2020-03-30 MED ORDER — DEXTROSE 50 % IV SOLN: 0.0000 mL | INTRAVENOUS | Status: DC | PRN

## 2020-03-30 MED ORDER — INSULIN STARTER KIT- PEN NEEDLES (ENGLISH)
1.0000 | Freq: Once | Status: DC
  Filled 2020-03-30 (×2): qty 1

## 2020-03-30 MED ORDER — INSULIN REGULAR(HUMAN) IN NACL 100-0.9 UT/100ML-% IV SOLN
INTRAVENOUS | Status: DC
  Administered 2020-03-30: 2.4 [IU]/h via INTRAVENOUS
  Filled 2020-03-30: qty 100

## 2020-03-30 MED ORDER — DOCUSATE SODIUM 100 MG PO CAPS
100.0000 mg | ORAL_CAPSULE | Freq: Every day | ORAL | Status: DC
  Administered 2020-04-01: 100 mg via ORAL
  Filled 2020-03-30 (×2): qty 1

## 2020-03-30 MED ORDER — LIVING WELL WITH DIABETES BOOK
Freq: Once | Status: DC
  Filled 2020-03-30: qty 1

## 2020-03-30 NOTE — Progress Notes (Signed)
Inpatient Diabetes Program Recommendations  AACE/ADA: New Consensus Statement on Inpatient Glycemic Control (2015)  Target Ranges:  Prepandial:   less than 140 mg/dL      Peak postprandial:   less than 180 mg/dL (1-2 hours)      Critically ill patients:  140 - 180 mg/dL   Lab Results  Component Value Date   GLUCAP 143 (H) 03/30/2020   HGBA1C 7.4 (H) 03/30/2020    Review of Glycemic Control Results for MELEA, PREZIOSO (MRN [id number]) as of 03/30/2020 10:00  Ref. Range 03/26/2020 17:49 03/30/2020 00:12 03/30/2020 07:31 03/30/2020 08:36 03/30/2020 09:36  Glucose-Capillary Latest Ref Range: 70 - 99 mg/dL 187 (H) 333 (H) 160 (H) 153 (H) 143 (H)  Results for LONNI, DIRDEN (MRN [id number]) as of 03/30/2020 10:00  Ref. Range 03/30/2020 00:49  Anion gap Latest Ref Range: 5 - 15  14   Diabetes history:  New DM  Current orders for Inpatient glycemic control:  IV Insulin  Inpatient diabetes recommendations:  When MD is ready to transition to SQ insulin and gap is closed, please consider,  1) NPH 14 units BID 2) Novolog 5 units tid with meals if eats at least 50% of meal 2) Pregnant sensitive correction 0-14 units tid after meals 3) 0200 cbg  Note:  Spoke with patient on the phone this morning about new diagnoses of diabetes.  Spoke with pt about new diagnosis.  Grandmother had DM2.  No previous DM in prior pregnancies.  Glucose tolerance test was normal per MD note.  Discussed A1C results and explained what an A1C is, basic pathophysiology of DM, basic home care, basic diabetes diet nutrition principles, importance of checking CBGs and maintaining good CBG control to prevent long-term and short-term complications. Reviewed signs and symptoms of hyperglycemia and hypoglycemia and how to treat hypoglycemia at home. Also reviewed blood sugar goals at home.  RNs to provide ongoing basic DM education at bedside with this patient. Have ordered educational booklet & insulin starter kit.. Have also  placed RD consult for DM diet education for this patient. Discussed importance of good glucose control during pregnancy and possible fetal complications.    Patient states she had been having a headache since Thursday and has recently had increased thirst and frequent voiding.  She went to her MD and her blood sugar was 187.  MD prescribed a meter.  She picked up her meter yesterday and checked her blood sugar and it was 497 mg/dl.  She called her MD and was instructed to come to hospital.    We discussed diet at length and CHO's and goal CHO's per meal.    Explained that she will require insulin at discharge.  Will ask nurses to please start educating patient on DM and allowing her to administer insulin once she transitions to SQ injections.    Discussed the insulin pen.  Will demonstrate on 8/16 as she was told she will be inpatient until Monday or Tuesday.  Starter kit was ordered.  She is aware to start looking at those instructions.  Also asked her to view videos on You Tube on insulin pen administration.    She is very receptive to all education and information.  DM team will follow closely.  Will continue to follow while inpatient.  Thank you, Reche Dixon, RN, BSN Diabetes Coordinator Inpatient Diabetes Program [phone] (team pager from 8a-5p)

## 2020-03-30 NOTE — MAU Provider Note (Signed)
History     CSN: [id number]  Arrival date and time: 03/29/20 2345   First Provider Initiated Contact with Patient 03/30/20 0014      No chief complaint on file.  Tanya Mccullough is a 29 y.o. G3P2002 at 52w4dwho [facility]  She presents today for elevated blood sugar.  Patient reports that she checked her blood sugar today for the first time and it was 487.  She states she checked it about 1.5 hours after eating a cobb salad with egg, t[place] and light ranch dressing.  Patient also reports having chicken/steak with zucchini hibachi for lunch and [place] eggs, grits, and OJ for breakfast.  Patient states that she has also been experiencing a HA since leaving the office on Wednesday that is not responsive to tylenol dosing.  Patient rates the HA a 7/10 and reports that it is just on her left side. Patient endorses fetal movement and denies vaginal concerns. She reports continued contractions, but states they are no worse than before.    OB History    Gravida  3   Para  2   Term  2   Preterm  0   AB  0   Living  2     SAB  0   TAB  0   Ectopic  0   Multiple  0   Live Births  2           Past Medical History:  Diagnosis Date   Migraines    Vaginal Pap smear, abnormal     Past Surgical History:  Procedure Laterality Date   CHOLECYSTECTOMY     GALLBLADDER SURGERY     TONSILECTOMY, ADENOIDECTOMY, BILATERAL MYRINGOTOMY AND TUBES      Family History  Problem Relation Age of Onset   Healthy Mother    Other Father        murdered   Diabetes Maternal Grandmother    Hypertension Maternal Grandmother    Migraines Cousin    ADD / ADHD Son    Asthma Son    Cancer Maternal Aunt    Hypertension Maternal Aunt     Social History   Tobacco Use   Smoking status: Never Smoker   Smokeless tobacco: Never Used  VScientific laboratory technicianUse: Never used  Substance Use Topics   Alcohol use: Not Currently    Comment: 1 glass  every days   Drug use: Never    Allergies:  Allergies  Allergen Reactions   Prochlorperazine Other (See Comments)    Lip twitching    Medications Prior to Admission  Medication Sig Dispense Refill Last Dose   cyclobenzaprine (FLEXERIL) 10 MG tablet Take 1 tablet (10 mg total) by mouth 2 (two) times daily as needed for muscle spasms. 20 tablet 0 Past Week at Unknown time   Prenatal MV-Min-FA-Omega-3 (PRENATAL GUMMIES/DHA & FA) 0.4-32.5 MG CHEW Chew 2 each by mouth daily. 1-2 gummy by mouth daily. 60 tablet 12 03/28/2020 at Unknown time   Accu-Chek Softclix Lancets lancets 1 each by Other route 4 (four) times daily. 100 each 12    Blood Glucose Monitoring Suppl (ACCU-CHEK NANO SMARTVIEW) w/Device KIT 1 kit by Subdermal route as directed. Check blood sugars for fasting, and two hours after breakfast, lunch and dinner (4 checks daily) 1 kit 0    Blood Pressure Monitoring (BLOOD PRESSURE KIT) DEVI 1 kit by Does not apply route once a week. Check Blood Pressure regularly and record readings into  the Babyscripts App.  Large Cuff.  DX O90.0 1 each 0    glucose blood (ACCU-CHEK SMARTVIEW) test strip Use as instructed to check blood sugars 100 each 12    Misc. Devices (GOJJI WEIGHT SCALE) MISC 1 Device by Does not apply route daily as needed. To weight self daily as needed at home. ICD-10 code: Z34.90 1 each 0    Misc. Devices (MEDELA DOUBLE BREAST PUMP) MISC 1 Pump by Does not apply route as needed. 1 each 0     Review of Systems  Constitutional: Negative for chills and fever.  Respiratory: Negative for cough and shortness of breath.   Gastrointestinal: Positive for abdominal pain (Cramping). Negative for constipation, diarrhea, nausea and vomiting.  Genitourinary: Negative for difficulty urinating, dysuria, vaginal bleeding and vaginal discharge.  Neurological: Positive for headaches (Left Side-Aching). Negative for dizziness and light-headedness.   Physical Exam   Blood pressure  118/77, pulse 99, temperature 97.6 F (36.4 C), temperature source Oral, resp. rate 17, height '5\' 7"'$  (1.702 m), weight 85.7 kg, last menstrual period 08/01/2019, SpO2 99 %.  Physical Exam Vitals reviewed.  Constitutional:      General: She is not in acute distress.    Appearance: Normal appearance.  HENT:     Head: Normocephalic and atraumatic.  Eyes:     Conjunctiva/sclera: Conjunctivae normal.  Cardiovascular:     Rate and Rhythm: Normal rate.  Pulmonary:     Effort: Pulmonary effort is normal. No respiratory distress.  Abdominal:     Comments: Gravid  Skin:    General: Skin is warm and dry.  Neurological:     Mental Status: She is alert and oriented to person, place, and time.  Psychiatric:        Mood and Affect: Mood normal.        Behavior: Behavior normal.        Thought Content: Thought content normal.     Fetal Assessment 155 bpm, Mod Var, -Decels, +15x15Accels Toco: Irregular  MAU Course   Results for orders placed or performed during the hospital encounter of 03/29/20 (from the past 24 hour(s))  Glucose, capillary     Status: Abnormal   Collection Time: 03/30/20 12:12 AM  Result Value Ref Range   Glucose-Capillary 333 (H) 70 - 99 mg/dL   No results found.  MDM PE Labs:CBG, UA EFM Assessment and Plan  29 year old G3P2002  SIUP at 32.4weeks Cat I FT Uncontrolled GDM  -Dr. Redmond School called and advises obtaining UA. Will admit for BS mgmt. -Patient informed of POC and has no questions or concerns.    Maryann Conners MSN, CNM 03/30/2020, 12:14 AM

## 2020-03-30 NOTE — Progress Notes (Signed)
FACULTY PRACTICE ANTEPARTUM(COMPREHENSIVE) NOTE  Tanya Mccullough is a 29 y.o. G3P2002 at [redacted]w[redacted]d by best clinical estimate who is admitted for diabetic regulation , surprisingly hyperglycemic after normal 2  Hr GTT at 28 wk.  Fetal presentation is cephalic. Length of Stay:  0  Days  Subjective: Pt had 3 liters NS administered thru the night, then had return of CMP just before morning rounds. K 4.7, so started on Glucose stabilizer . Patient reports the fetal movement as active. Patient reports uterine contraction  activity as irregular, every 15 minutes. Patient reports  vaginal bleeding as none. Patient describes fluid per vagina as None.  Vitals:  Blood pressure 103/66, pulse 74, temperature 98.1 F (36.7 C), temperature source Oral, resp. rate 18, height 5\' 7"  (1.702 m), weight 85.7 kg, last menstrual period 08/01/2019, SpO2 99 %. Physical Examination:  General appearance - alert, well appearing, and in no distress Heart - normal rate and regular rhythm Abdomen - soft, nontender, nondistended Fundal Height:  size equals dates Cervical Exam: Not evaluated. and found to be not evaluated/ / and fetal presentation is cephalic. Extremities: extremities normal, atraumatic, no cyanosis or edema and Homans sign is negative, no sign of DVT with DTRs 2+ bilaterally Membranes:intact  Fetal Monitoring:  Baseline: 150 bpm, Variability: Good {> 6 bpm), Accelerations: Reactive and Decelerations: Absent  Labs:  Results for orders placed or performed during the hospital encounter of 03/29/20 (from the past 24 hour(s))  Urinalysis, Routine w reflex microscopic Urine, Clean Catch   Collection Time: 03/30/20 12:04 AM  Result Value Ref Range   Color, Urine STRAW (A) YELLOW   APPearance CLEAR CLEAR   Specific Gravity, Urine 1.020 1.005 - 1.030   pH 6.0 5.0 - 8.0   Glucose, UA >=500 (A) NEGATIVE mg/dL   Hgb urine dipstick NEGATIVE NEGATIVE   Bilirubin Urine NEGATIVE NEGATIVE   Ketones, ur 20 (A)  NEGATIVE mg/dL   Protein, ur NEGATIVE NEGATIVE mg/dL   Nitrite NEGATIVE NEGATIVE   Leukocytes,Ua NEGATIVE NEGATIVE   RBC / HPF 0-5 0 - 5 RBC/hpf   WBC, UA 0-5 0 - 5 WBC/hpf   Bacteria, UA RARE (A) NONE SEEN   Squamous Epithelial / LPF 0-5 0 - 5  Urine rapid drug screen (hosp performed)   Collection Time: 03/30/20 12:04 AM  Result Value Ref Range   Opiates NONE DETECTED NONE DETECTED   Cocaine NONE DETECTED NONE DETECTED   Benzodiazepines NONE DETECTED NONE DETECTED   Amphetamines NONE DETECTED NONE DETECTED   Tetrahydrocannabinol NONE DETECTED NONE DETECTED   Barbiturates NONE DETECTED NONE DETECTED  Glucose, capillary   Collection Time: 03/30/20 12:12 AM  Result Value Ref Range   Glucose-Capillary 333 (H) 70 - 99 mg/dL  Beta-hydroxybutyric acid   Collection Time: 03/30/20 12:49 AM  Result Value Ref Range   Beta-Hydroxybutyric Acid 1.31 (H) 0.05 - 0.27 mmol/L  Comprehensive metabolic panel   Collection Time: 03/30/20 12:49 AM  Result Value Ref Range   Sodium 132 (L) 135 - 145 mmol/L   Potassium 4.7 3.5 - 5.1 mmol/L   Chloride 99 98 - 111 mmol/L   CO2 19 (L) 22 - 32 mmol/L   Glucose, Bld 318 (H) 70 - 99 mg/dL   BUN 9 6 - 20 mg/dL   Creatinine, Ser 04/01/20 0.44 - 1.00 mg/dL   Calcium 9.7 8.9 - 3.29 mg/dL   Total Protein 7.2 6.5 - 8.1 g/dL   Albumin 3.0 (L) 3.5 - 5.0 g/dL   AST 39 15 -  41 U/L   ALT 19 0 - 44 U/L   Alkaline Phosphatase 132 (H) 38 - 126 U/L   Total Bilirubin 0.7 0.3 - 1.2 mg/dL   GFR calc non Af Amer >60 >60 mL/min   GFR calc Af Amer >60 >60 mL/min   Anion gap 14 5 - 15  Hemoglobin A1c   Collection Time: 03/30/20 12:49 AM  Result Value Ref Range   Hgb A1c MFr Bld 7.4 (H) 4.8 - 5.6 %   Mean Plasma Glucose 165.68 mg/dL  TSH   Collection Time: 03/30/20 12:49 AM  Result Value Ref Range   TSH 1.581 0.350 - 4.500 uIU/mL  Type and screen [facility]   Collection Time: 03/30/20 12:49 AM  Result Value Ref Range   ABO/RH(D) AB POS    Antibody  Screen NEG    Sample Expiration      04/02/2020,2359 Performed at [facility], [address], [place], [place] [address]   SARS Coronavirus 2 by RT PCR (hospital order, performed in [facility] hospital lab) Nasopharyngeal Nasopharyngeal Swab   Collection Time: 03/30/20  1:24 AM   Specimen: Nasopharyngeal Swab  Result Value Ref Range   SARS Coronavirus 2 NEGATIVE NEGATIVE  Glucose, capillary   Collection Time: 03/30/20  7:31 AM  Result Value Ref Range   Glucose-Capillary 160 (H) 70 - 99 mg/dL  Glucose, capillary   Collection Time: 03/30/20  8:36 AM  Result Value Ref Range   Glucose-Capillary 153 (H) 70 - 99 mg/dL    Imaging Studies:     Currently EPIC will not allow sonographic studies to automatically po---------------------------------------------------------------------- Biometry  BPD:      86.8  mm     G. Age:  35w 0d         64  %    CI:        77.55   %    70 - 86                                                          FL/HC:       21.7  %    20.1 - 22.3  HC:       312   mm     G. Age:  34w 6d         23  %    HC/AC:       0.98       0.93 - 1.11  AC:      319.2  mm     G. Age:  35w 6d         87  %    FL/BPD:      78.0  %    71 - 87  FL:       67.7  mm     G. Age:  34w 6d         48  %    FL/AC:       21.2  %    20 - 24  HUM:      58.1  mm     G. Age:  33w 5d         45  %  Est. FW:    2664   gm   5 lb  14 oz      70  % ---------------------------------------------------------------------- OB History  Gravidity:    3         Term:   2        Prem:   0        SAB:   0  TOP:          0       Ectopic:  0        Living: 2 ---------------------------------------------------------------------- Gestational Age  LMP:           34w 4d        Date:  08/01/19                 EDD:   05/07/20  U/S Today:     35w 1d                                        EDD:   05/03/20  Best:          34w 4d     Det. By:  LMP  (08/01/19)          EDD:    05/07/20 ---------------------------------------------------------------------- Anatomy  Cranium:               Appears normal         LVOT:                   Previously seen  Cavum:                 Previously seen        Aortic Arch:            Previously seen  Ventricles:            Previously seen        Ductal Arch:            Previously seen  Choroid Plexus:        Previously seen        Diaphragm:              Appears normal  Cerebellum:            Previously seen        Stomach:                Appears normal, left                                                                        sided  Posterior Fossa:       Previously seen        Abdomen:                Previously seen  Nuchal Fold:           Previously seen        Abdominal Wall:         Previously seen  Face:                  Orbits and profile     Cord Vessels:  Previously seen                         previously seen  Lips:                  Previously seen        Kidneys:                Appear normal  Palate:                Not well visualized    Bladder:                Appears normal  Thoracic:              Appears normal         Spine:                  Limited views nl                                                                        again seen  Heart:                 Previously seen        Upper Extremities:      Previously seen  RVOT:                  Previously seen        Lower Extremities:      Previously seen  Other:  Female gender. Heels prev visualized. Lt hand/5th prev visualized. ---------------------------------------------------------------------- Comments  This patient was seen for a follow up growth scan due to  uncontrolled gestational diabetes.  The fetal growth and amniotic fluid level appears appropriate  for her gestational age.  She is being admitted for glycemic control. ----------------------------------------------------------------------                   Ma RingsVictor Fang,  MD Electronically Signed Final Report   03/30/2020 09:00 ampulate into notes.  In the meantime, copy and paste results into note or free text.  Medications:  Scheduled . docusate sodium  100 mg Oral Daily  . enoxaparin (LOVENOX) injection  40 mg Subcutaneous Q24H  . prenatal multivitamin  1 tablet Oral Q1200   I have reviewed the patient's current medications.  ASSESSMENT: Patient Active Problem List   Diagnosis Date Noted  . Hyperglycemia during pregnancy 03/30/2020  . Hyperglycemia in pregnancy 03/26/2020  . Preterm uterine contractions in third trimester, antepartum 03/26/2020  . Supervision of other normal pregnancy, antepartum 10/13/2019    PLAN: Insulin IV today Will add procardia due to contractions for symptomatic relief Switch to sq insulin per diabetic educator.  Tilda BurrowJohn V Deigo Alonso 03/30/2020,9:09 AM    Patient ID: Tanya Mccullough, female   DOB: 11/15/1990, 29 y.o.   MRN: [id number]

## 2020-03-30 NOTE — H&P (Signed)
Tanya Mccullough is a 29 y.o. [id number] at 36w4dadmitted for diabetic control, mild ketoacidosis new Diagnosis Diabetes..   Now with mild headache, thirst polyuria x 3 days. 0 Fetal presentation is unsure.  History of Present Illness: This G3P2002 at 354w4dollowed at [facility] a new dx of DM , after getting Glucometer yesterday and having first cbg in 400 range at home, confirmed as  CBG (last 3)  Here.  Recent Labs    03/30/20 0012  GLUCAP 333*    Patient reports the fetal movement as active. Patient reports uterine contraction  activity as irregular, every 10 minutes.over last week, mild, no bleeding or ROM Patient reports  vaginal bleeding as none. Patient describes fluid per vagina as None.  Patient Active Problem List   Diagnosis Date Noted  . Hyperglycemia during pregnancy 03/30/2020  . Hyperglycemia in pregnancy 03/26/2020  . Preterm uterine contractions in third trimester, antepartum 03/26/2020  . Supervision of other normal pregnancy, antepartum 10/13/2019   Past Medical History: Past Medical History:  Diagnosis Date  . Migraines   . Vaginal Pap smear, abnormal     Past Surgical History: Past Surgical History:  Procedure Laterality Date  . CHOLECYSTECTOMY    . GALLBLADDER SURGERY    . TONSILECTOMY, ADENOIDECTOMY, BILATERAL MYRINGOTOMY AND TUBES      Obstetrical History: OB History    Gravida  3   Para  2   Term  2   Preterm  0   AB  0   Living  2     SAB  0   TAB  0   Ectopic  0   Multiple  0   Live Births  2           Gynecological History: negative negative for - dysuria.  Social History: Social History   Socioeconomic History  . Marital status: Single    Spouse name: Not on file  . Number of children: 2  . Years of education: Not on file  . Highest education level: Bachelor's degree (e.g., BA, AB, BS)  Occupational History    Comment: medical admin  Tobacco Use  . Smoking status: Never Smoker  . Smokeless  tobacco: Never Used  Vaping Use  . Vaping Use: Never used  Substance and Sexual Activity  . Alcohol use: Not Currently    Comment: 1 glass every days  . Drug use: Never  . Sexual activity: Yes  Other Topics Concern  . Not on file  Social History Narrative   Lives with 2 sons   Caffeine- rare soda   Social Determinants of Health   Financial Resource Strain:   . Difficulty of Paying Living Expenses:   Food Insecurity:   . Worried About RuCharity fundraisern the Last Year:   . RaArboriculturistn the Last Year:   Transportation Needs:   . LaFilm/video editorMedical):   . Marland Kitchenack of Transportation (Non-Medical):   Physical Activity:   . Days of Exercise per Week:   . Minutes of Exercise per Session:   Stress:   . Feeling of Stress :   Social Connections:   . Frequency of Communication with Friends and Family:   . Frequency of Social Gatherings with Friends and Family:   . Attends Religious Services:   . Active Member of Clubs or Organizations:   . Attends ClArchivisteetings:   . Marland Kitchenarital Status:     Family History: Family History  Problem  Relation Age of Onset  . Healthy Mother   . Other Father        murdered  . Diabetes Maternal Grandmother   . Hypertension Maternal Grandmother   . Migraines Cousin   . ADD / ADHD Son   . Asthma Son   . Cancer Maternal Aunt   . Hypertension Maternal Aunt     Allergies: Allergies  Allergen Reactions  . Prochlorperazine Other (See Comments)    Lip twitching    Medications Prior to Admission  Medication Sig Dispense Refill Last Dose  . cyclobenzaprine (FLEXERIL) 10 MG tablet Take 1 tablet (10 mg total) by mouth 2 (two) times daily as needed for muscle spasms. 20 tablet 0 Past Week at Unknown time  . Prenatal MV-Min-FA-Omega-3 (PRENATAL GUMMIES/DHA & FA) 0.4-32.5 MG CHEW Chew 2 each by mouth daily. 1-2 gummy by mouth daily. 60 tablet 12 03/28/2020 at Unknown time  . Accu-Chek Softclix Lancets lancets 1 each by Other  route 4 (four) times daily. 100 each 12   . Blood Glucose Monitoring Suppl (ACCU-CHEK NANO SMARTVIEW) w/Device KIT 1 kit by Subdermal route as directed. Check blood sugars for fasting, and two hours after breakfast, lunch and dinner (4 checks daily) 1 kit 0   . Blood Pressure Monitoring (BLOOD PRESSURE KIT) DEVI 1 kit by Does not apply route once a week. Check Blood Pressure regularly and record readings into the Bab[employer] App.  Large Cuff.  DX O90.0 1 each 0   . glucose blood (ACCU-CHEK SMARTVIEW) test strip Use as instructed to check blood sugars 100 each 12   . Misc. Devices (GOJJI WEIGHT SCALE) MISC 1 Device by Does not apply route daily as needed. To weight self daily as needed at home. ICD-10 code: Z34.90 1 each 0   . Misc. Devices (MEDELA DOUBLE BREAST PUMP) MISC 1 Pump by Does not apply route as needed. 1 each 0     Review of Systems  Constitutional: Negative for chills and fever.  Cardiovascular: Negative for chest pain.  Gastrointestinal: Negative for abdominal pain.  Genitourinary: Negative for difficulty urinating and dysuria.   Review of Systems  Constitutional: Negative for chills and fever.  Cardiovascular: Negative for chest pain.  Gastrointestinal: Negative for abdominal pain.  Genitourinary: Negative for difficulty urinating and dysuria.   Vitals:  Blood pressure 118/77, pulse 99, temperature 97.6 F (36.4 C), temperature source Oral, resp. rate 17, height 5' 7" (1.702 m), weight 85.7 kg, last menstrual period 08/01/2019, SpO2 99 %. Physical Examination:  General appearance - alert, well appearing, and in no distress, oriented to person, place, and time, normal appearing weight and dehydrated Chest - clear to auscultation, no wheezes, rales or rhonchi, symmetric air entry Abdomen - soft, nontender, nondistended, no masses or organomegaly Abdomen: gravid and fundal height  is size equals dates Pelvic Exam: Cervix: Not evaluated. Extremities: extremities normal,  atraumatic, no cyanosis or edema and Homans sign is negative, no sign of DVT with DTRs  Membranes:intact Fetal Monitoring:Baseline: 245 bpm, Variability: Good {> 6 bpm) and Accelerations: Reactive   Labs:  Results for orders placed or performed during the hospital encounter of 03/29/20 (from the past 24 hour(s))  Urinalysis, Routine w reflex microscopic Urine, Clean Catch   Collection Time: 03/30/20 12:04 AM  Result Value Ref Range   Color, Urine STRAW (A) YELLOW   APPearance CLEAR CLEAR   Specific Gravity, Urine 1.020 1.005 - 1.030   pH 6.0 5.0 - 8.0   Glucose, UA >=500 (A) NEGATIVE  mg/dL   Hgb urine dipstick NEGATIVE NEGATIVE   Bilirubin Urine NEGATIVE NEGATIVE   Ketones, ur 20 (A) NEGATIVE mg/dL   Protein, ur NEGATIVE NEGATIVE mg/dL   Nitrite NEGATIVE NEGATIVE   Leukocytes,Ua NEGATIVE NEGATIVE   RBC / HPF 0-5 0 - 5 RBC/hpf   WBC, UA 0-5 0 - 5 WBC/hpf   Bacteria, UA RARE (A) NONE SEEN   Squamous Epithelial / LPF 0-5 0 - 5  Glucose, capillary   Collection Time: 03/30/20 12:12 AM  Result Value Ref Range   Glucose-Capillary 333 (H) 70 - 99 mg/dL    Imaging Studies: [place] MFM FETAL BPP WO NON STRESS  Result Date: 03/26/2020 ----------------------------------------------------------------------  OBSTETRICS REPORT                       (Signed Final 03/26/2020 05:57 pm) ---------------------------------------------------------------------- Patient Info  ID #:       [id number]                          D.O.B.:  [birth year] (29 yrs)  Name:       Duanne Limerick                 Visit Date: 03/26/2020 04:48 pm ---------------------------------------------------------------------- Performed By  Attending:        Johnell Comings MD         Ref. Address:     [address]                                                             [address] [place]                                                              [place]  Performed By:     Novella Rob        [facility]  Referred By:      [facility] ---------------------------------------------------------------------- Orders  #  Description                           Code        Ordered By  1  [place] MFM FETAL BPP WO NON               76819.01    JESSICA EMLY     STRESS ----------------------------------------------------------------------  #  Order #                     Accession #                Episode #  1  [id number]                   [id number]                 [id number] ---------------------------------------------------------------------- Indications  Non-reactive NST                               O28.9  Decreased fetal movement                       O36.8190  [redacted] weeks gestation of pregnancy                Z3A.34 ---------------------------------------------------------------------- Fetal Evaluation  Num Of Fetuses:         1  Fetal Heart Rate(bpm):  160  Cardiac Activity:       Observed  Presentation:           Cephalic  Placenta:               Anterior  P. Cord Insertion:      Previously Visualized  Amniotic Fluid  AFI FV:      Within normal limits  AFI Sum(cm)     %Tile       Largest Pocket(cm)  24.5            94          7.9  RUQ(cm)       RLQ(cm)       LUQ(cm)        LLQ(cm)  6.4           5.1           5.1            7.9 ---------------------------------------------------------------------- Biophysical Evaluation  Amniotic F.V:   Within normal limits       F. Tone:        Observed  F. Movement:    Observed                   Score:          8/8  F. Breathing:   Observed ---------------------------------------------------------------------- OB History  Gravidity:    3         Term:   2  Prem:    0        SAB:   0  TOP:          0       Ectopic:  0        Living: 2 ---------------------------------------------------------------------- Gestational Age  LMP:           34w 0d        Date:  08/01/19                 EDD:   05/07/20  Best:          34w 0d     Det. By:  LMP  (08/01/19)          EDD:   05/07/20 ---------------------------------------------------------------------- Anatomy  Stomach:               Appears normal, left   Bladder:                Appears normal                         sided  Kidneys:               Appear normal ---------------------------------------------------------------------- Cervix Uterus Adnexa  Cervix  Not visualized (advanced GA >24wks) ---------------------------------------------------------------------- Comments  This patient was seen for a biophysical profile due to a  nonreactive nonstress test.  A biophysical profile performed today was 8 out of 8.  Borderline polyhydramnios was noted on today's exam. ----------------------------------------------------------------------                   Johnell Comings, MD Electronically Signed Final Report   03/26/2020 05:57 pm ----------------------------------------------------------------------   . docusate sodium  100 mg Oral Daily  . enoxaparin (LOVENOX) injection  40 mg Subcutaneous Q24H  . prenatal multivitamin  1 tablet Oral Q1200   I have reviewed the patient's current medications.   ASSESSMENT: Patient Active Problem List   Diagnosis Date Noted  . Hyperglycemia during pregnancy 03/30/2020  . Hyperglycemia in pregnancy 03/26/2020  . Preterm uterine contractions in third trimester, antepartum 03/26/2020  . Supervision of other normal pregnancy, antepartum 10/13/2019  mild  ketonuria

## 2020-03-30 NOTE — Hospital Course (Signed)
Admitted for diabetic regulation after a pregnancy in which she passed her 2 hr GTT but is now markedly hyperglycemic.  Received 3 L NS rehydration, K noted normal at 4.7, mild anion gap of 14.  Placed on Glucose stabilizer.  U/s singleton cephalic at 78%ile for gest age. Normal fluid.

## 2020-03-30 NOTE — Progress Notes (Signed)
Nutrition  Diet consult for diet education acknowledged Will complete Monday 8/16   [facility] M.Odis Luster LDN Neonatal Nutrition Support Specialist/RD III

## 2020-03-31 DIAGNOSIS — Z794 Long term (current) use of insulin: Secondary | ICD-10-CM

## 2020-03-31 DIAGNOSIS — E1165 Type 2 diabetes mellitus with hyperglycemia: Secondary | ICD-10-CM

## 2020-03-31 DIAGNOSIS — Z3A34 34 weeks gestation of pregnancy: Secondary | ICD-10-CM

## 2020-03-31 DIAGNOSIS — O24912 Unspecified diabetes mellitus in pregnancy, second trimester: Secondary | ICD-10-CM

## 2020-03-31 LAB — BASIC METABOLIC PANEL
Anion gap: 9 (ref 5–15)
Anion gap: 11 (ref 5–15)
BUN: 5 mg/dL — ABNORMAL LOW (ref 6–20)
BUN: 6 mg/dL (ref 6–20)
CO2: 20 mmol/L — ABNORMAL LOW (ref 22–32)
CO2: 19 mmol/L — ABNORMAL LOW (ref 22–32)
Calcium: 9.1 mg/dL (ref 8.9–10.3)
Calcium: 9.1 mg/dL (ref 8.9–10.3)
Chloride: 105 mmol/L (ref 98–111)
Chloride: 104 mmol/L (ref 98–111)
Creatinine, Ser: 0.36 mg/dL — ABNORMAL LOW (ref 0.44–1.00)
Creatinine, Ser: 0.64 mg/dL (ref 0.44–1.00)
GFR calc Af Amer: >60 mL/min (ref >60)
GFR calc Af Amer: >60 mL/min (ref >60)
GFR calc non Af Amer: >60 mL/min (ref >60)
GFR calc non Af Amer: >60 mL/min (ref >60)
Glucose, Bld: 128 mg/dL — ABNORMAL HIGH (ref 70–99)
Glucose, Bld: 209 mg/dL — ABNORMAL HIGH (ref 70–99)
Potassium: 3.7 mmol/L (ref 3.5–5.1)
Potassium: 4 mmol/L (ref 3.5–5.1)
Sodium: 134 mmol/L — ABNORMAL LOW (ref 135–145)
Sodium: 134 mmol/L — ABNORMAL LOW (ref 135–145)

## 2020-03-31 LAB — GLUCOSE, CAPILLARY
Glucose-Capillary: 112 mg/dL — ABNORMAL HIGH (ref 70–99)
Glucose-Capillary: 177 mg/dL — ABNORMAL HIGH (ref 70–99)
Glucose-Capillary: 127 mg/dL — ABNORMAL HIGH (ref 70–99)
Glucose-Capillary: 143 mg/dL — ABNORMAL HIGH (ref 70–99)

## 2020-03-31 LAB — CULTURE, BETA STREP (GROUP B ONLY): Strep Gp B Culture: NEGATIVE

## 2020-03-31 MED ORDER — INSULIN ASPART 100 UNIT/ML ~~LOC~~ SOLN
0.0000 [IU] | Freq: Three times a day (TID) | SUBCUTANEOUS | Status: DC
  Administered 2020-03-31 (×3): 2 [IU] via SUBCUTANEOUS
  Administered 2020-04-01: 1 [IU] via SUBCUTANEOUS

## 2020-03-31 NOTE — Progress Notes (Signed)
Patient ID: Tanya Mccullough, female   DOB: 08/11/1991, 29 y.o.   MRN: [id number] [facility]) NOTE  Tanya Mccullough is a 29 y.o. G3P2002 at 37w5dby best clinical estimate who is admitted for DKA.   Fetal presentation is cephalic. Length of Stay:  1  Days  ASSESSMENT: Active Problems:   Hyperglycemia during pregnancy   PLAN: Continue NPH 14 units twice daily Continue NovoLog 5 units with meals Insulin teaching today Insulin adjustment as needed Continue diabetic diet Anticipate discharge tomorrow once teaching is complete.  Subjective: Feels well, no new complaints.  Had questionable loss of mucous plug this morning.  Denies painful contractions. Patient reports the fetal movement as active. Patient reports uterine contraction  activity as none. Patient reports  vaginal bleeding as none. Patient describes fluid per vagina as None.  Vitals:  Blood pressure 106/60, pulse 76, temperature 98.5 F (36.9 C), temperature source Oral, resp. rate 18, height _0  (1.702 m), weight 85.7 kg, last menstrual period 08/01/2019, SpO2 97 %. Physical Examination:  General appearance - alert, well appearing, and in no distress Chest - normal effort Abdomen - gravid, non-tender Fundal Height:  size equals dates Extremities: Homans sign is negative, no sign of DVT  Membranes:intact  Fetal Monitoring:  Baseline: 145 bpm, Variability: Good {> 6 bpm), Accelerations: Reactive and Decelerations: Absent  Labs:  Results for orders placed or performed during the hospital encounter of 03/29/20 (from the past 24 hour(s))  Glucose, capillary   Collection Time: 03/30/20  8:36 AM  Result Value Ref Range   Glucose-Capillary 153 (H) 70 - 99 mg/dL  Glucose, capillary   Collection Time: 03/30/20  9:36 AM  Result Value Ref Range   Glucose-Capillary 143 (H) 70 - 99 mg/dL  Basic metabolic panel   Collection Time: 03/30/20 10:33 AM  Result Value Ref Range   Sodium 134 (L) 135 -  145 mmol/L   Potassium 3.6 3.5 - 5.1 mmol/L   Chloride 106 98 - 111 mmol/L   CO2 18 (L) 22 - 32 mmol/L   Glucose, Bld 162 (H) 70 - 99 mg/dL   BUN <5 (L) 6 - 20 mg/dL   Creatinine, Ser 0.68 0.44 - 1.00 mg/dL   Calcium 8.9 8.9 - 10.3 mg/dL   GFR calc non Af Amer >60 >60 mL/min   GFR calc Af Amer >60 >60 mL/min   Anion gap 10 5 - 15  Glucose, capillary   Collection Time: 03/30/20 10:37 AM  Result Value Ref Range   Glucose-Capillary 139 (H) 70 - 99 mg/dL  Glucose, capillary   Collection Time: 03/30/20 11:36 AM  Result Value Ref Range   Glucose-Capillary 146 (H) 70 - 99 mg/dL  Glucose, capillary   Collection Time: 03/30/20 12:40 PM  Result Value Ref Range   Glucose-Capillary 134 (H) 70 - 99 mg/dL  Glucose, capillary   Collection Time: 03/30/20  1:35 PM  Result Value Ref Range   Glucose-Capillary 122 (H) 70 - 99 mg/dL  Glucose, capillary   Collection Time: 03/30/20  2:41 PM  Result Value Ref Range   Glucose-Capillary 119 (H) 70 - 99 mg/dL  Glucose, capillary   Collection Time: 03/30/20  3:36 PM  Result Value Ref Range   Glucose-Capillary 116 (H) 70 - 99 mg/dL  Basic metabolic panel   Collection Time: 03/30/20  3:50 PM  Result Value Ref Range   Sodium 135 135 - 145 mmol/L   Potassium 3.6 3.5 - 5.1 mmol/L   Chloride 108 98 -  111 mmol/L   CO2 20 (L) 22 - 32 mmol/L   Glucose, Bld 123 (H) 70 - 99 mg/dL   BUN <5 (L) 6 - 20 mg/dL   Creatinine, Ser 0.57 0.44 - 1.00 mg/dL   Calcium 8.8 (L) 8.9 - 10.3 mg/dL   GFR calc non Af Amer >60 >60 mL/min   GFR calc Af Amer >60 >60 mL/min   Anion gap 7 5 - 15  Glucose, capillary   Collection Time: 03/30/20  4:38 PM  Result Value Ref Range   Glucose-Capillary 113 (H) 70 - 99 mg/dL  Glucose, capillary   Collection Time: 03/30/20  5:36 PM  Result Value Ref Range   Glucose-Capillary 103 (H) 70 - 99 mg/dL  Glucose, capillary   Collection Time: 03/30/20  9:59 PM  Result Value Ref Range   Glucose-Capillary 185 (H) 70 - 99 mg/dL  Basic  metabolic panel   Collection Time: 03/30/20 10:23 PM  Result Value Ref Range   Sodium 134 (L) 135 - 145 mmol/L   Potassium 3.9 3.5 - 5.1 mmol/L   Chloride 103 98 - 111 mmol/L   CO2 21 (L) 22 - 32 mmol/L   Glucose, Bld 201 (H) 70 - 99 mg/dL   BUN <5 (L) 6 - 20 mg/dL   Creatinine, Ser 0.67 0.44 - 1.00 mg/dL   Calcium 9.1 8.9 - 10.3 mg/dL   GFR calc non Af Amer >60 >60 mL/min   GFR calc Af Amer >60 >60 mL/min   Anion gap 10 5 - 15  Basic metabolic panel   Collection Time: 03/31/20  4:05 AM  Result Value Ref Range   Sodium 134 (L) 135 - 145 mmol/L   Potassium 3.7 3.5 - 5.1 mmol/L   Chloride 105 98 - 111 mmol/L   CO2 20 (L) 22 - 32 mmol/L   Glucose, Bld 128 (H) 70 - 99 mg/dL   BUN 5 (L) 6 - 20 mg/dL   Creatinine, Ser 0.36 (L) 0.44 - 1.00 mg/dL   Calcium 9.1 8.9 - 10.3 mg/dL   GFR calc non Af Amer >60 >60 mL/min   GFR calc Af Amer >60 >60 mL/min   Anion gap 9 5 - 15  Glucose, capillary   Collection Time: 03/31/20  5:00 AM  Result Value Ref Range   Glucose-Capillary 112 (H) 70 - 99 mg/dL     Medications:  Scheduled . docusate sodium  100 mg Oral Daily  . enoxaparin (LOVENOX) injection  40 mg Subcutaneous Q24H  . insulin aspart  0-14 Units Subcutaneous TID PC  . insulin aspart  5 Units Subcutaneous TID WC  . insulin NPH Human  14 Units Subcutaneous BID AC & HS  . insulin starter kit- pen needles  1 kit Other Once  . living well with diabetes book   Does not apply Once  . prenatal multivitamin  1 tablet Oral Q1200   I have reviewed the patient's current medications.   Donnamae Jude, MD 03/31/2020,7:42 AM

## 2020-03-31 NOTE — Progress Notes (Signed)
Inpatient Diabetes Program Recommendations  AACE/ADA: New Consensus Statement on Inpatient Glycemic Control (2015)  Target Ranges:  Prepandial:   less than 140 mg/dL      Peak postprandial:   less than 180 mg/dL (1-2 hours)      Critically ill patients:  140 - 180 mg/dL   Lab Results  Component Value Date   GLUCAP 177 (H) 03/31/2020   HGBA1C 7.4 (H) 03/30/2020    Review of Glycemic Control Results for Tanya Mccullough, Tanya Mccullough (MRN [id number]) as of 03/31/2020 11:25  Ref. Range 03/31/2020 11:10  Glucose-Capillary Latest Ref Range: 70 - 99 mg/dL 409 (H) 2 hr pp after novolog 5 meal coverage     Inpatient Diabetes Program Recommendations:    Might consider increasing meal coverage to 8 units tid with meals given she eats at least 50% of meal  Will continue to follow while inpatient.  Thank you, Dulce Sellar, RN, BSN Diabetes Coordinator Inpatient Diabetes Program [phone] (team pager from 8a-5p)

## 2020-04-01 LAB — GLUCOSE, CAPILLARY
Glucose-Capillary: 101 mg/dL — ABNORMAL HIGH (ref 70–99)
Glucose-Capillary: 104 mg/dL — ABNORMAL HIGH (ref 70–99)
Glucose-Capillary: 110 mg/dL — ABNORMAL HIGH (ref 70–99)
Glucose-Capillary: 88 mg/dL (ref 70–99)

## 2020-04-01 MED ORDER — DOCUSATE SODIUM 100 MG PO CAPS: 100.0000 mg | ORAL_CAPSULE | Freq: Every day | ORAL | 0 refills | Status: DC

## 2020-04-01 MED ORDER — INSULIN NPH (HUMAN) (ISOPHANE) 100 UNIT/ML ~~LOC~~ SUSP
16.0000 [IU] | Freq: Two times a day (BID) | SUBCUTANEOUS | 11 refills | Status: DC
Start: 2020-04-01 — End: 2020-04-19

## 2020-04-01 MED ORDER — INSULIN LISPRO (1 UNIT DIAL) 100 UNIT/ML (KWIKPEN): 5.0000 [IU] | PEN_INJECTOR | Freq: Three times a day (TID) | SUBCUTANEOUS | 11 refills | Status: DC

## 2020-04-01 MED ORDER — INSULIN PEN NEEDLE 32G X 4 MM MISC: 1.0000 | Freq: Four times a day (QID) | 4 refills | Status: DC

## 2020-04-01 NOTE — TOC Benefit Eligibility Note (Signed)
Transition of Care [facility]) Benefit Eligibility Note    Patient Details  Name: Erricka Falkner MRN: [id number] Date of Birth: September 05, 1990   Medication/Dose: Humlin 14 unit bid 30 day supply  Covered?: Yes  Tier: 2 Drug  Prescription Coverage [facility]  Spoke with Person/Company/Phone Number:: Barkley Bruns and Maggie K. [employer] [facility] Help Desk @[phone]  Co-Pay: $35.00  Prior Approval: No  Deductible:  (No Deductible)       [phone] Phone Number: 04/01/2020, 3:26 PM

## 2020-04-01 NOTE — Discharge Summary (Signed)
Physician Discharge Summary  Patient ID: Tanya Mccullough MRN: [id number] DOB/AGE: 1991-07-09 29 y.o.  Admit date: 03/29/2020 Discharge date: 04/01/2020  Admission Diagnoses:  Discharge Diagnoses:  Active Problems:   Hyperglycemia during pregnancy   Discharged Condition: good  Hospital Course: pt was presented to [facility] with mild diabetic ketoacidosis. In clinic her first CBG was 431m range at home.  She was admitted for glycemic control, started on IVFs and glucose stabilizer. By HSuzi Rootsher glucose was optimized on current regimen, she has seen nutrition and diabetic management.  She was deemed stable for discharge. Follow up in 3-4 days.  Regarding fetal well being, NST was reactive throughout hospital stay.    Consults: diabetic managment, nutrition counsleing.   Significant Diagnostic Studies: labs: glucose range at discharge 68-104  Treatments: IV hydration and insulin: Humalog  Discharge Exam: Blood pressure 105/66, pulse 86, temperature 97.9 F (36.6 C), temperature source Oral, resp. rate 18, height _0  (1.702 m), weight 85.7 kg, last menstrual period 08/01/2019, SpO2 100 %. General appearance: cooperative GI: soft, non-tender; bowel sounds normal; no masses,  no organomegaly  Disposition: Discharge disposition: 01-Home or Self Care       Discharge Instructions    Discharge activity:  No Restrictions   Complete by: As directed    Follow up in clinic on 8/20   Discharge diet:   Complete by: As directed    Diabetic diet   No sexual activity restrictions   Complete by: As directed      Allergies as of 04/01/2020      Reactions   Prochlorperazine Other (See Comments)   Lip twitching      Medication List    TAKE these medications   Accu-Chek Nano SmartView w/Device Kit 1 kit by Subdermal route as directed. Check blood sugars for fasting, and two hours after breakfast, lunch and dinner (4 checks daily)   Accu-Chek SmartView test strip Generic drug: glucose  blood Use as instructed to check blood sugars   Accu-Chek Softclix Lancets lancets 1 each by Other route 4 (four) times daily.   acetaminophen 500 MG tablet Commonly known as: TYLENOL Take 1,000 mg by mouth every 6 (six) hours as needed for mild pain or headache.   Blood Pressure Kit Devi 1 kit by Does not apply route once a week. Check Blood Pressure regularly and record readings into the [employer] App.  Large Cuff.  DX O90.0   cyclobenzaprine 10 MG tablet Commonly known as: FLEXERIL Take 1 tablet (10 mg total) by mouth 2 (two) times daily as needed for muscle spasms.   docusate sodium 100 MG capsule Commonly known as: COLACE Take 1 capsule (100 mg total) by mouth daily. Start taking on: April 02, 2020   insulin lispro 100 UNIT/ML KwikPen Commonly known as: HumaLOG KwikPen Inject 0.05 mLs (5 Units total) into the skin 3 (three) times daily with meals. 0-14 units TID 2 hours after meals.   insulin NPH Human 100 UNIT/ML injection Commonly known as: HumuLIN N Inject 0.16 mLs (16 Units total) into the skin 2 (two) times daily before a meal. Before breakfast and dinner.   Insulin Pen Needle 32G X 4 MM Misc 1 Device by Does not apply route QID.   Medela Double Breast Pump Misc 1 Pump by Does not apply route as needed.   Gojji Weight Scale Misc 1 Device by Does not apply route daily as needed. To weight self daily as needed at home. ICD-10 code: Z34.90   Prenatal Gummies/DHA &  FA 0.4-32.5 MG Chew Chew 2 each by mouth daily. 1-2 gummy by mouth daily.        Signed: Cherre Blanc 04/01/2020, 5:53 PM

## 2020-04-01 NOTE — Progress Notes (Signed)
Nutrition consult: diet education  Nutrition Dx: Food and nutrition-related knowledge deficit r/t no previous education aeb newly diagnosed GDM.   Nutrition education consult for Carbohydrate Modified Gestational Diabetic Diet completed.  "Meal  plan for gestational diabetics" handout given to patient.  Basic concepts reviewed.  Questions answered.  Patient verbalizes understanding.   Elisabeth Cara M.Odis Luster LDN Neonatal Nutrition Support Specialist/RD III

## 2020-04-01 NOTE — Progress Notes (Signed)
Pt discharged after discharge instructions given. All questions answered. IV discontinued. Pt discharged in stable condition with all belongings.  

## 2020-04-01 NOTE — Progress Notes (Addendum)
Inpatient Diabetes Program Recommendations  ADA Standards of Care 2021 Diabetes in Pregnancy Target Glucose Ranges:  Fasting: 60 - 90 mg/dL Preprandial: 60 - 105 mg/dL 1 hr postprandial: Less than 19m/dL (from first bite of meal) 2 hr postprandial: Less than 120 mg/dL (from first bit of meal)    Lab Results  Component Value Date   GLUCAP 110 (H) 04/01/2020   HGBA1C 7.4 (H) 03/30/2020    Review of Glycemic Control Results for GALPA, SALVO(MRN [id number] as of 04/01/2020 08:02  Ref. Range 03/31/2020 11:10 03/31/2020 16:55 03/31/2020 21:30 04/01/2020 00:30 04/01/2020 07:45  Glucose-Capillary Latest Ref Range: 70 - 99 mg/dL 177 (H) 127 (H) 143 (H) 104 (H) 110 (H)    Inpatient Diabetes Program Recommendations:    Please consider NPH 16 units BID   Note:  Spoke with patient at bedside.  Educated patient on insulin pen use at home. Reviewed contents of insulin flexpen starter kit. Reviewed all steps of insulin pen including attachment of needle, 2-unit air shot, dialing up dose, giving injection, removing needle, disposal of sharps, storage of unused insulin, disposal of insulin etc. Patient able to provide successful return demonstration. Also reviewed troubleshooting with insulin pen. MD to give patient Rxs for insulin pens and insulin pen needles.  Discussed hypoglycemia, symptoms and treatment.  Discussed CHO's again and goal CHO per meal.  Reviewed CBG goals fasting, and postprandial.  Asked her to check her CBG's 4 times per day.  [facility].  Call MD for CBG's consistently out of goal range.    Asked TOC team for a benefit check on NPH.  If cost is too high; will recommend Relion Novolin N flexpen and needles.  She has a meter at home along with strips.    Provided patient with educational materials on DM & CHO's.  Encouraged close follow up with MD.    Will reach out to patient once benefit check is complete.  Addendum _0 --After several  attempts trying to call [employer] for insulin coverage. Went to patients room and called number on back of her card with patient.  The insulins below are preferred and covered for $35 monthly for each.  Patient states she can afford this co-pay.  Please order at DC--  -  Humalog 5 units tid with meals  -  Humalog 0-14 units tid 2 hours after meals Humalog Kwikpen order # [id number]   -  Humulin N 16 units BID  There is no order number available.  Search Insulin NPH it will show as (HUMULIN N, NOVOLIN N) injection; then specify Humulin N  -  Insulin pen needles order # [id number]  Again, reviewed insulin administration instructions.    Will continue to follow while inpatient.  Thank you, JReche Dixon RN, BSN Diabetes Coordinator Inpatient Diabetes Program [phone](team pager from 8a-5p)     Will continue to follow while inpatient.  Thank you, JReche Dixon RN, BSN Diabetes Coordinator Inpatient Diabetes Program [phone](team pager from 8a-5p)\

## 2020-04-02 ENCOUNTER — Telehealth: Payer: Self-pay | Admitting: *Deleted

## 2020-04-02 DIAGNOSIS — O99810 Abnormal glucose complicating pregnancy: Secondary | ICD-10-CM

## 2020-04-02 NOTE — Telephone Encounter (Signed)
Contacted pt to complete transition of care assessment:  Transition Care Management Follow-up Telephone Call   [facility] Managed Care Transition Call Status:MM [facility] Call Made   Date of discharge and from where: [facility], 04/01/20   How have you been since you were released from the hospital? "feel good"   Any questions or concerns? No  Items Reviewed:  Did the pt receive and understand the discharge instructions provided? Yes   Medications obtained and verified? Yes   Any new allergies since your discharge? No   Dietary orders reviewed? Yes  Do you have support at home? Yes, family  Functional Questionnaire: (I = Independent and D = Dependent)  ADLs: Independent Bathing/Dressing:Independent Meal Prep: Independent Eating: Independent Maintaining continence: Independent Transferring/Ambulation: Independent Managing Meds: Independent   Follow up appointments reviewed:   PCP Hospital f/u appt confirmed? Yes  Scheduled to see on [name], NP on 04/23/20 @ 0930  Specialist Hospital f/u appt confirmed? No, patient states she was told that she had to transfer to another OB?GYN because [facility] does not provide the services that she needs. She has not been contacted by the new practice. The patient states that she has no prenatal care as of discharge from the hospital  Are transportation arrangements needed? No   If their condition worsens, is the pt aware to call PCP or go to the EmergencyDept.? Yes  Was the patient provided with contact information for the PCP's office or ED? No, patient she has no OB/GYN since discharge from hospital  Was to pt encouraged to call back with questions or concerns? Yes  Order placed for [facility] Coordination, SW for follow up.

## 2020-04-03 ENCOUNTER — Other Ambulatory Visit: Payer: BLUE CROSS/BLUE SHIELD

## 2020-04-03 ENCOUNTER — Other Ambulatory Visit: Payer: Self-pay

## 2020-04-03 ENCOUNTER — Encounter: Payer: Self-pay | Admitting: General Practice

## 2020-04-03 NOTE — Patient Instructions (Signed)
Visit Information  Ms. Greeson was given information about Medicaid Managed Care team care coordination services and consented to engagement with the [facility] Managed Care team.   Goals Addressed              This Visit's Progress   .  "I want to learn about my insulin and controlling my sugar" (pt-stated)        CARE PLAN ENTRY Medicaid Managed Care (see longtitudinal plan of care for additional care plan information)  Objective:  Lab Results  Component Value Date   HGBA1C 7.4 (H) 03/30/2020 .   Lab Results  Component Value Date   CREATININE 0.64 03/31/2020   CREATININE 0.36 (L) 03/31/2020   CREATININE 0.67 03/30/2020   . Patient reported cbg findings: fasting 04/03/20=89, postprandial < 140  Current Barriers:  Marland Kitchen Knowledge Deficits related to basic Diabetes pathophysiology and self care/management . Knowledge Deficits related to medications used for management of diabetes-pt has questions about insulin regimen  Case Manager Clinical Goal(s):  Marland Kitchen Over the next 30 days, patient will demonstrate improved adherence to prescribed treatment plan for diabetes self care/management as evidenced by: verbalized understanding of medication regimen and telephone visits with pharmacist, CBG reading with established parameters . daily monitoring and recording of CBG  . adherence to ADA/ carb modified diet . adherence to prescribed medication regimen  Interventions:  . Provided education to patient about basic DM disease process . Reviewed medications with patient and discussed importance of medication adherence . Discussed plans with patient for ongoing care management follow up and provided patient with direct contact information for care management team . Reviewed scheduled/upcoming provider appointments including: OB follow up on 04/16/20 . Advised patient, providing education and rationale, to check cbg QID and record, calling OB provider for findings outside established parameters.     . Referral made to pharmacy team for assistance with insulin and medication education  Patient Self Care Activities:  . UNABLE to independently manage insulin-has questions for pharmacist . Self administers oral medications as prescribed . Self administers insulin as prescribed . Attends all scheduled provider appointments . Checks blood sugars as prescribed and utilize hyper and hypoglycemia protocol as needed . Adheres to prescribed ADA/carb modified  Initial goal documentation        Patient verbalizes understanding of instructions provided today.   The Managed Medicaid care management team will reach out to the patient again over the next 7 days.    Estanislado Emms, RN THN/MM RNCM

## 2020-04-03 NOTE — Patient Outreach (Cosign Needed Addendum)
Care Coordination - Case Manager  04/03/2020  Tanya Mccullough [birth year] [id number]  Subjective:  Tanya Mccullough is an 29 y.o. year old female who is a primary patient of Tanya Bang, DO.  Tanya Mccullough was given information about Medicaid Managed Care team care coordination services today. Tanya Mccullough agreed to services and verbal consent obtained  Review of patient status, laboratory and other test data was performed as part of evaluation for provision of services.  SDOH: SDOH Screenings   Alcohol Screen:   . Last Alcohol Screening Score (AUDIT):   Depression (PHQ2-9): Low Risk   . PHQ-2 Score: 0  Financial Resource Strain:   . Difficulty of Paying Living Expenses:   Food Insecurity:   . Worried About Charity fundraiser in the Last Year:   . [place] in the Last Year:   Housing:   . Last Housing Risk Score:   Physical Activity:   . Days of Exercise per Week:   . Minutes of Exercise per Session:   Social Connections:   . Frequency of Communication with Friends and Family:   . Frequency of Social Gatherings with Friends and Family:   . Attends Religious Services:   . Active Member of Clubs or Organizations:   . Attends Archivist Meetings:   Marland Kitchen Marital Status:   Stress:   . Feeling of Stress :   Tobacco Use: Low Risk   . Smoking Tobacco Use: Never Smoker  . Smokeless Tobacco Use: Never Used  Transportation Needs:   . Film/video editor (Medical):   Marland Kitchen Lack of Transportation (Non-Medical):      Objective:    Allergies  Allergen Reactions  . Prochlorperazine Other (See Comments)    Lip twitching    Medications:    Medications Reviewed Today    Reviewed by Tanya Montane, RN (Registered Nurse) on 04/03/20 at 1614  Med List Status: <None>  Medication Order Taking? Sig Documenting Provider Last Dose Status Informant  Accu-Chek Softclix Lancets lancets [id number] Yes 1 each by Other route 4 (four)  times daily. Tanya Mccullough, CNM Taking Active Self  acetaminophen (TYLENOL) 500 MG tablet [id number]  Take 1,000 mg by mouth every 6 (six) hours as needed for mild pain or headache. [provider]  Active Self  Blood Glucose Monitoring Suppl (ACCU-CHEK NANO SMARTVIEW) w/Device KIT [id number] Yes 1 kit by Subdermal route as directed. Check blood sugars for fasting, and two hours after breakfast, lunch and dinner (4 checks daily) Tanya Mccullough, CNM Taking Active Self  Blood Pressure Monitoring (BLOOD PRESSURE KIT) DEVI [id number]  1 kit by Does not apply route once a week. Check Blood Pressure regularly and record readings into the Babyscripts App.  Large Cuff.  DX O90.0 Tanya Bombard, MD  Active Self  cyclobenzaprine (FLEXERIL) 10 MG tablet [id number]  Take 1 tablet (10 mg total) by mouth 2 (two) times daily as needed for muscle spasms. Darlina Rumpf, CNM  Active Self           Med Note Tanya Mccullough   XJO Mar 30, 2020 12:51 PM) Hasn't started  docusate sodium (COLACE) 100 MG capsule [id number]  Take 1 capsule (100 mg total) by mouth daily. Cherre Blanc, MD  Active   glucose blood (ACCU-CHEK SMARTVIEW) test strip [id number] Yes Use as instructed to check blood sugars Tanya Mccullough, CNM Taking Active Self  insulin lispro (HUMALOG KWIKPEN) 100 UNIT/ML KwikPen [id number] Yes Inject 0.05  mLs (5 Units total) into the skin 3 (three) times daily with meals. 0-14 units TID 2 hours after meals. Cherre Blanc, MD Taking Active            Med Note Thamas Jaegers, Wanita Derenzo A   Wed Apr 03, 2020  4:14 PM) Referring to [facility] pharmacist for insulin education  insulin NPH Human (HUMULIN N) 100 UNIT/ML injection [id number] Yes Inject 0.16 mLs (16 Units total) into the skin 2 (two) times daily before a meal. Before breakfast and dinner. Cherre Blanc, MD Taking Active            Med Note Thamas Jaegers, Cephas Revard A   Wed Apr 03, 2020  4:13 PM) Referring to [facility] pharmacist for further insulin education  Insulin  Pen Needle 32G X 4 MM MISC [id number] Yes 1 Device by Does not apply route QID. Cherre Blanc, MD Taking Active   Misc. Devices [facility] WEIGHT SCALE) MISC [id number]  1 Device by Does not apply route daily as needed. To weight self daily as needed at home. ICD-10 code: Z34.90 Tanya Mccullough, CNM  Active Self  Misc. Devices (MEDELA DOUBLE BREAST PUMP) [place] [id number]  1 Pump by Does not apply route as needed. Tanya Mccullough, CNM  Active Self  Prenatal MV-Min-FA-Omega-3 (PRENATAL GUMMIES/DHA & FA) 0.4-32.5 MG CHEW [id number]  Chew 2 each by mouth daily. 1-2 gummy by mouth daily. Tresea Mall, CNM  Active Self          Assessment:   Goals Addressed              This Visit's Progress   .  "I want to learn about my insulin and controlling my sugar" (pt-stated)        [place] Medicaid Managed Care (see longtitudinal plan of care for additional care plan information)  Objective:  Lab Results  Component Value Date   HGBA1C 7.4 (H) 03/30/2020 .   Lab Results  Component Value Date   CREATININE 0.64 03/31/2020   CREATININE 0.36 (L) 03/31/2020   CREATININE 0.67 03/30/2020   . Patient reported cbg findings: fasting 04/03/20=89, postprandial < 140  Current Barriers:  Marland Kitchen Knowledge Deficits related to basic Diabetes pathophysiology and self care/management . Knowledge Deficits related to medications used for management of diabetes-pt has questions about insulin regimen  Case Manager Clinical Goal(s):  Marland Kitchen Over the next 30 days, patient will demonstrate improved adherence to prescribed treatment plan for diabetes self care/management as evidenced by: verbalized understanding of medication regimen and telephone visits with pharmacist, CBG reading with established parameters . daily monitoring and recording of CBG  . adherence to ADA/ carb modified diet . adherence to prescribed medication regimen  Interventions:  . Provided education to patient about basic DM disease  process . Reviewed medications with patient and discussed importance of medication adherence . Discussed plans with patient for ongoing care management follow up and provided patient with direct contact information for care management team . Reviewed scheduled/upcoming provider appointments including: OB follow up on 04/16/20 . Advised patient, providing education and rationale, to check cbg QID and record, calling OB provider for findings outside established parameters.   . Referral made to pharmacy team for assistance with insulin and medication education  Patient Self Care Activities:  . UNABLE to independently manage insulin-has questions for pharmacist . Self administers oral medications as prescribed . Self administers insulin as prescribed . Attends all scheduled provider appointments . Checks blood sugars as prescribed and utilize hyper and  hypoglycemia protocol as needed . Adheres to prescribed ADA/carb modified  Initial goal documentation        Plan: Telephone follow up by [facility] pharmacist and RNCM over the next 7 days.  Lurena Joiner, RN [facility]/Medicaid Managed Care

## 2020-04-04 ENCOUNTER — Other Ambulatory Visit: Payer: Self-pay | Admitting: *Deleted

## 2020-04-04 ENCOUNTER — Encounter: Payer: BLUE CROSS/BLUE SHIELD | Admitting: Obstetrics and Gynecology

## 2020-04-04 ENCOUNTER — Other Ambulatory Visit: Payer: Self-pay

## 2020-04-04 ENCOUNTER — Other Ambulatory Visit: Payer: Self-pay | Admitting: Obstetrics and Gynecology

## 2020-04-04 DIAGNOSIS — O24913 Unspecified diabetes mellitus in pregnancy, third trimester: Secondary | ICD-10-CM

## 2020-04-04 MED ORDER — INSULIN STARTER KIT- SYRINGES (ENGLISH): 1.0000 | Freq: Once | 0 refills | Status: AC

## 2020-04-08 ENCOUNTER — Other Ambulatory Visit: Payer: Self-pay | Admitting: *Deleted

## 2020-04-08 DIAGNOSIS — O24913 Unspecified diabetes mellitus in pregnancy, third trimester: Secondary | ICD-10-CM

## 2020-04-08 MED ORDER — "INSULIN SYRINGE-NEEDLE U-100 25G X 5/8"" 1 ML MISC": 1.0000 | Freq: Two times a day (BID) | 2 refills | Status: DC

## 2020-04-10 ENCOUNTER — Ambulatory Visit: Payer: Self-pay

## 2020-04-10 ENCOUNTER — Other Ambulatory Visit: Payer: Self-pay | Admitting: *Deleted

## 2020-04-10 ENCOUNTER — Telehealth: Payer: Self-pay | Admitting: *Deleted

## 2020-04-10 ENCOUNTER — Other Ambulatory Visit: Payer: Self-pay

## 2020-04-10 NOTE — Patient Outreach (Signed)
Care Coordination  04/10/2020  Tanya Mccullough 1991/03/25 [id number]  Unsuccessful return call attempt.  Plan: Scheduled telephone outreach 04/11/20 @ 1:30 pm.   Estanislado Emms RN, BSN [place]  [facility] RN Care Coordinator

## 2020-04-10 NOTE — Patient Instructions (Signed)
Hello Ms. Ellingsen,  I tried to reach you by phone today to talk about your blood sugars. I will be out of the office tomorrow and will ask my partner to give you a call in the afternoon. Please feel free to reach out to me today until 5 pm.  Estanislado Emms RN, BSN [place]  [facility] RN Care Coordinator

## 2020-04-10 NOTE — Patient Outreach (Signed)
Care Coordination  04/10/2020  Tanya Mccullough [birth year] [id number]   Unsuccessful outreach call for Medicaid Managed Care High Risk OB follow up.   Plan: Will reschedule telephone follow up.  Estanislado Emms RN, BSN [place]  [facility]

## 2020-04-11 ENCOUNTER — Encounter: Payer: BLUE CROSS/BLUE SHIELD | Admitting: Obstetrics and Gynecology

## 2020-04-11 ENCOUNTER — Other Ambulatory Visit: Payer: Self-pay | Admitting: *Deleted

## 2020-04-11 ENCOUNTER — Other Ambulatory Visit: Payer: Self-pay

## 2020-04-11 NOTE — Patient Outreach (Signed)
Care Coordination - Case Manager  04/11/2020  Tanya Mccullough June 24, 1991 [id number]  Subjective:  Tanya Mccullough is an 29 y.o. year old female who is a primary patient of Nicolette Bang, DO.  Ms. Barcia was given information about Medicaid Managed Care team care coordination services today. Ether Griffins agreed to services and verbal consent obtained  Review of patient status, laboratory and other test data was performed as part of evaluation for provision of services.  SDOH: SDOH Screenings   Alcohol Screen:   . Last Alcohol Screening Score (AUDIT): Not on file  Depression (PHQ2-9): Low Risk   . PHQ-2 Score: 0  Financial Resource Strain:   . Difficulty of Paying Living Expenses: Not on file  Food Insecurity:   . Worried About Charity fundraiser in the Last Year: Not on file  . Ran Out of Food in the Last Year: Not on file  Housing:   . Last Housing Risk Score: Not on file  Physical Activity:   . Days of Exercise per Week: Not on file  . Minutes of Exercise per Session: Not on file  Social Connections: Unknown  . Frequency of Communication with Friends and Family: More than three times a week  . Frequency of Social Gatherings with Friends and Family: More than three times a week  . Attends Religious Services: Not on file  . Active Member of Clubs or Organizations: Not on file  . Attends Archivist Meetings: Not on file  . Marital Status: Not on file  Stress:   . Feeling of Stress : Not on file  Tobacco Use: Low Risk   . Smoking Tobacco Use: Never Smoker  . Smokeless Tobacco Use: Never Used  Transportation Needs:   . Film/video editor (Medical): Not on file  . Lack of Transportation (Non-Medical): Not on file   SDOH Interventions     Most Recent Value  SDOH Interventions  Social Connections Interventions Intervention Not Indicated      Objective:    Allergies  Allergen Reactions  . Prochlorperazine  Other (See Comments)    Lip twitching    Medications:    Medications Reviewed Today    Reviewed by Clerance Lav, RN (Registered Nurse) on 04/11/20 at 1415  Med List Status: <None>  Medication Order Taking? Sig Documenting Provider Last Dose Status Informant  Accu-Chek Softclix Lancets lancets [id number] Yes 1 each by Other route 4 (four) times daily. Gavin Pound, CNM Taking Active Self  acetaminophen (TYLENOL) 500 MG tablet [id number]  Take 1,000 mg by mouth every 6 (six) hours as needed for mild pain or headache. [provider]  Active Self  Blood Glucose Monitoring Suppl (ACCU-CHEK NANO SMARTVIEW) w/Device KIT [id number] Yes 1 kit by Subdermal route as directed. Check blood sugars for fasting, and two hours after breakfast, lunch and dinner (4 checks daily) Gavin Pound, CNM Taking Active Self  Blood Pressure Monitoring (BLOOD PRESSURE KIT) DEVI [id number]  1 kit by Does not apply route once a week. Check Blood Pressure regularly and record readings into the Babyscripts App.  Large Cuff.  DX O90.0 Shelly Bombard, MD  Active Self  cyclobenzaprine (FLEXERIL) 10 MG tablet [id number]  Take 1 tablet (10 mg total) by mouth 2 (two) times daily as needed for muscle spasms. Darlina Rumpf, CNM  Active Self           Med Note Marcell Barlow   KWI Mar 30, 2020 12:51 PM) Hasn't  started  docusate sodium (COLACE) 100 MG capsule [id number]  Take 1 capsule (100 mg total) by mouth daily. Malachy Chamber, MD  Active   glucose blood (ACCU-CHEK SMARTVIEW) test strip [id number] Yes Use as instructed to check blood sugars Gerrit Heck, CNM Taking Active Self  insulin lispro (HUMALOG KWIKPEN) 100 UNIT/ML KwikPen [id number] Yes Inject 0.05 mLs (5 Units total) into the skin 3 (three) times daily with meals. 0-14 units TID 2 hours after meals. Malachy Chamber, MD Taking Active            Med Note Ardelia Mems, MELANIE A   Wed Apr 03, 2020  4:14 PM) Referring to [facility] pharmacist for insulin education    insulin NPH Human (HUMULIN N) 100 UNIT/ML injection [id number] Yes Inject 0.16 mLs (16 Units total) into the skin 2 (two) times daily before a meal. Before breakfast and dinner. Malachy Chamber, MD Taking Active            Med Note Ardelia Mems, MELANIE A   Wed Apr 03, 2020  4:13 PM) Referring to [facility] pharmacist for further insulin education  Insulin Pen Needle 32G X 4 MM MISC [id number] Yes 1 Device by Does not apply route QID. Malachy Chamber, MD Taking Active   Insulin Syringe-Needle U-100 25G X 5/8" 1 ML MISC [id number] Yes 1 each by Does not apply route 2 (two) times daily before a meal. Raelyn Mora, CNM Taking Active            Med Note Ayesha Rumpf Apr 11, 2020  2:15 PM) Drugstore has box of 100 on back order; provided patient with 10 syringes last week and again this week; patient has new pack of 10 on hand  Misc. Devices [facility] WEIGHT SCALE) MISC [id number]  1 Device by Does not apply route daily as needed. To weight self daily as needed at home. ICD-10 code: Z34.90 Raelyn Mora, CNM  Active Self  Misc. Devices (MEDELA DOUBLE BREAST PUMP) MISC [id number]  1 Pump by Does not apply route as needed. Raelyn Mora, CNM  Active Self  Prenatal MV-Min-FA-Omega-3 (PRENATAL GUMMIES/DHA & FA) 0.4-32.5 MG CHEW [id number]  Chew 2 each by mouth daily. 1-2 gummy by mouth daily. Armando Reichert, CNM  Active Self          Assessment:   Goals Addressed              This Visit's Progress   .  "I want to learn about my insulin and controlling my sugar" (pt-stated)        CARE PLAN ENTRY Medicaid Managed Care (see longtitudinal plan of care for additional care plan information)  Objective:  Lab Results  Component Value Date   HGBA1C 7.4 (H) 03/30/2020 .   Lab Results  Component Value Date   CREATININE 0.64 03/31/2020   CREATININE 0.36 (L) 03/31/2020   CREATININE 0.67 03/30/2020   . Patient reported cbg findings: fasting 98-11, postprandial > 90 < 140  Current Barriers:   Marland Kitchen Knowledge Deficits related to basic Diabetes pathophysiology and self care/management; 04/11/20 - patient reports cbg 90 and associated symptoms including "mild" light headedness and "stumbling on my words a little bit" . Knowledge Deficits related to medications used for management of diabetes-pt has questions about insulin regimen; 04/11/20 reviewed insulin regimen with patient and addressed questions about injection site rotation  Case Manager Clinical Goal(s):  Marland Kitchen Over the next 30 days, patient will demonstrate improved adherence to prescribed treatment plan  for diabetes self care/management as evidenced by: verbalized understanding of medication regimen and telephone visits with pharmacist, CBG reading with established parameters . daily monitoring and recording of CBG adherence to ADA/ carb modified diet  adherence to prescribed medication regimen  Interventions:  . Provided education to patient about basic DM disease process . Reviewed medications with patient and discussed importance of medication adherence . Reviewed appropriate foods/snacks to take when symptoms of hypoglycemia or cbg <90 found; advised to have appropriate snacks on hand when away from home . Discussed plans with patient for ongoing care management follow up and provided patient with direct contact information for care management team . Reviewed scheduled/upcoming provider appointments including: OB follow up on 04/16/20 . Advised patient, providing education and rationale, to check cbg QID and record, calling OB provider for findings outside established parameters.  Confirmed that patient is monitoring QID . Patient unable to speak with [facility] pharmacist (unable to connect by phone) but did speak with community pharmacist re: obtaining syringes and insulin regimen  Patient Self Care Activities:  . UNABLE to independently manage insulin-has questions for pharmacist . Self administers oral medications as prescribed . Self  administers insulin as prescribed . Attends all scheduled provider appointments . Checks blood sugars as prescribed and utilize hyper and hypoglycemia protocol as needed . Adheres to prescribed ADA/carb modified  Please see past updates related to this goal by clicking on the "Past Updates" button in the selected goal         Plan: [place] will follow up with patient by phone over next 7 days.   [place] Management Coordinator Direct Dial:  [phone]  Fax: [phone]

## 2020-04-11 NOTE — Patient Instructions (Addendum)
Visit Information  [name] - so nice speaking with you today. You are taking great care of yourself and your baby! GREAT JOB!    Following are some educational materials related to the items we discussed today. Please let me know if you have any questions by sending a note in [place] by calling.    Hypoglycemia Hypoglycemia is when the sugar (glucose) level in your blood is too low. Signs of low blood sugar may include:  Feeling: ? Hungry. ? Worried or nervous (anxious). ? Sweaty and clammy. ? Confused. ? Dizzy. ? Sleepy. ? Sick to your stomach (nauseous).  Having: ? A fast heartbeat. ? A headache. ? A change in your vision. ? Tingling or no feeling (numbness) around your mouth, lips, or tongue. ? Jerky movements that you cannot control (seizure).  Having trouble with: ? Moving (coordination). ? Sleeping. ? Passing out (fainting). ? Getting upset easily (irritability). Low blood sugar can happen to people who have diabetes and people who do not have diabetes. Low blood sugar can happen quickly, and it can be an emergency. Treating low blood sugar Low blood sugar is often treated by eating or drinking something sugary right away, such as:  Fruit juice, 4-6 oz (120-150 mL).  Regular soda (not diet soda), 4-6 oz (120-150 mL).  Low-fat milk, 4 oz (120 mL).  Several pieces of hard candy.  Sugar or honey, 1 Tbsp (15 mL). Treating low blood sugar if you have diabetes If you can think clearly and swallow safely, follow the 15:15 rule:  Take 15 grams of a fast-acting carb (carbohydrate). Talk with your doctor about how much you should take.  Always keep a source of fast-acting carb with you, such as: ? Sugar tablets (glucose pills). Take 3-4 pills. ? 6-8 pieces of hard candy. ? 4-6 oz (120-150 mL) of fruit juice. ? 4-6 oz (120-150 mL) of regular (not diet) soda. ? 1 Tbsp (15 mL) honey or sugar.  Check your blood sugar 15 minutes after you take the carb.  If your  blood sugar is still at or below 70 mg/dL (3.9 mmol/L), take 15 grams of a carb again.  If your blood sugar does not go above 70 mg/dL (3.9 mmol/L) after 3 tries, get help right away.  After your blood sugar goes back to normal, eat a meal or a snack within 1 hour.  Treating very low blood sugar If your blood sugar is at or below 54 mg/dL (3 mmol/L), you have very low blood sugar (severe hypoglycemia). This may also cause:  Passing out.  Jerky movements you cannot control (seizure).  Losing consciousness (coma). This is an emergency. Do not wait to see if the symptoms will go away. Get medical help right away. Call your local emergency services (911 in the [place] Do not drive yourself to the hospital. If you have very low blood sugar and you cannot eat or drink, you may need a glucagon shot (injection). A family member or friend should learn how to check your blood sugar and how to give you a glucagon shot. Ask your doctor if you need to have a glucagon shot kit at home. Follow these instructions at home: General instructions  Take over-the-counter and prescription medicines only as told by your doctor.  Stay aware of your blood sugar as told by your doctor.  Limit alcohol intake to no more than 1 drink a day for nonpregnant women and 2 drinks a day for men. One drink equals  12 oz of beer (355 mL), 5 oz of wine (148 mL), or 1 oz of hard liquor (44 mL).  Keep all follow-up visits as told by your doctor. This is important. If you have diabetes:   Follow your diabetes care plan as told by your doctor. Make sure you: ? Know the signs of low blood sugar. ? Take your medicines as told. ? Follow your exercise and meal plan. ? Eat on time. Do not skip meals. ? Check your blood sugar as often as told by your doctor. Always check it before and after exercise. ? Follow your sick day plan when you cannot eat or drink normally. Make this plan ahead of time with your doctor.  Share your  diabetes care plan with: ? Your work or school. ? People you live with.  Check your pee (urine) for ketones: ? When you are sick. ? As told by your doctor.  Carry a card or wear jewelry that says you have diabetes. Contact a doctor if:  You have trouble keeping your blood sugar in your target range.  You have low blood sugar often. Get help right away if:  You still have symptoms after you eat or drink something sugary.  Your blood sugar is at or below 80.  You have jerky movements that you cannot control.  You pass out. These symptoms may be an emergency. Do not wait to see if the symptoms will go away. Get medical help right away. Call your local emergency services (911 in the [place] Do not drive yourself to the hospital. Summary  Hypoglycemia happens when the level of sugar (glucose) in your blood is too low.  Low blood sugar can happen to people who have diabetes and people who do not have diabetes. Low blood sugar can happen quickly, and it can be an emergency.  Make sure you know the signs of low blood sugar and know how to treat it.  Always keep a source of sugar (fast-acting carb) with you to treat low blood sugar. This information is not intended to replace advice given to you by your health care provider. Make sure you discuss any questions you have with your health care provider. Document Revised: 11/24/2018 Document Reviewed: 09/06/2015 Elsevier Patient Education  [place].  Hyperglycemia Hyperglycemia is when the sugar (glucose) level in your blood is too high. It may not cause symptoms. If you do have symptoms, they may include warning signs, such as:  Feeling more thirsty than normal.  Hunger.  Feeling tired.  Needing to pee (urinate) more than normal.  Blurry eyesight (vision). You may get other symptoms as it gets worse, such as:  Dry mouth.  Not being hungry (loss of appetite).  Fruity-smelling breath.  Weakness.  Weight gain or  loss that is not planned. Weight loss may be fast.  A tingling or numb feeling in your hands or feet.  Headache.  Skin that does not bounce back quickly when it is lightly pinched and released (poor skin turgor).  Pain in your belly (abdomen).  Cuts or bruises that heal slowly. High blood sugar can happen to people who do or do not have diabetes. High blood sugar can happen slowly or quickly, and it can be an emergency. Follow these instructions at home: General instructions  Take over-the-counter and prescription medicines only as told by your doctor.  Do not use products that contain nicotine or tobacco, such as cigarettes and e-cigarettes. If you need help quitting, ask your  doctor.  Limit alcohol intake to no more than 1 drink per day for nonpregnant women and 2 drinks per day for men. One drink equals 12 oz of beer, 5 oz of wine, or 1 oz of hard liquor.  Manage stress. If you need help with this, ask your doctor.  Keep all follow-up visits as told by your doctor. This is important. Eating and drinking   Stay at a healthy weight.  Exercise regularly, as told by your doctor.  Drink enough fluid, especially when you: ? Exercise. ? Get sick. ? Are in hot temperatures.  Eat healthy foods, such as: ? Low-fat (lean) proteins. ? Complex carbs (complex carbohydrates), such as whole wheat bread or brown rice. ? Fresh fruits and vegetables. ? Low-fat dairy products. ? Healthy fats.  Drink enough fluid to keep your pee (urine) clear or pale yellow. If you have diabetes:   Make sure you know the symptoms of hyperglycemia.  Follow your diabetes management plan, as told by your doctor. Make sure you: ? Take insulin and medicines as told. ? Follow your exercise plan. ? Follow your meal plan. Eat on time. Do not skip meals. ? Check your blood sugar as often as told. Make sure to check before and after exercise. If you exercise longer or in a different way than you normally  do, check your blood sugar more often. ? Follow your sick day plan whenever you cannot eat or drink normally. Make this plan ahead of time with your doctor.  Share your diabetes management plan with people in your workplace, school, and household.  Check your urine for ketones when you are ill and as told by your doctor.  Carry a card or wear jewelry that says that you have diabetes. Contact a doctor if:  Your blood sugar level is higher than 240 mg/dL (13.3 mmol/L) for 2 days in a row.  You have problems keeping your blood sugar in your target range.  High blood sugar happens often for you. Get help right away if:  You have trouble breathing.  You have a change in how you think, feel, or act (mental status).  You feel sick to your stomach (nauseous), and that feeling does not go away.  You cannot stop throwing up (vomiting). These symptoms may be an emergency. Do not wait to see if the symptoms will go away. Get medical help right away. Call your local emergency services (911 in the [place] Do not drive yourself to the hospital. Summary  Hyperglycemia is when the sugar (glucose) level in your blood is too high.  High blood sugar can happen to people who do or do not have diabetes.  Make sure you drink enough fluids, eat healthy foods, and exercise regularly.  Contact your doctor if you have problems keeping your blood sugar in your target range. This information is not intended to replace advice given to you by your health care provider. Make sure you discuss any questions you have with your health care provider. Document Revised: 04/20/2016 Document Reviewed: 04/20/2016 Elsevier Patient Education  2020 [place].     [name] was given information about Medicaid Managed Care team care coordination services and consented to engagement with the [facility] Managed Care team.   Goals Addressed              This Visit's Progress   .  "I want to learn about my insulin  and controlling my sugar" (pt-stated)  CARE PLAN ENTRY Medicaid Managed Care (see longtitudinal plan of care for additional care plan information)  Objective:  Lab Results  Component Value Date   HGBA1C 7.4 (H) 03/30/2020 .   Lab Results  Component Value Date   CREATININE 0.64 03/31/2020   CREATININE 0.36 (L) 03/31/2020   CREATININE 0.67 03/30/2020   . Patient reported cbg findings: fasting 98-11, postprandial > 90 < 140  Current Barriers:  Marland Kitchen Knowledge Deficits related to basic Diabetes pathophysiology and self care/management; 04/11/20 - patient reports cbg 90 and associated symptoms including "mild" light headedness and "stumbling on my words a little bit" . Knowledge Deficits related to medications used for management of diabetes-pt has questions about insulin regimen; 04/11/20 reviewed insulin regimen with patient and addressed questions about injection site rotation  Case Manager Clinical Goal(s):  Marland Kitchen Over the next 30 days, patient will demonstrate improved adherence to prescribed treatment plan for diabetes self care/management as evidenced by: verbalized understanding of medication regimen and telephone visits with pharmacist, CBG reading with established parameters . daily monitoring and recording of CBG adherence to ADA/ carb modified diet  adherence to prescribed medication regimen  Interventions:  . Provided education to patient about basic DM disease process . Reviewed medications with patient and discussed importance of medication adherence . Reviewed appropriate foods/snacks to take when symptoms of hypoglycemia or cbg <90 found; advised to have appropriate snacks on hand when away from home . Discussed plans with patient for ongoing care management follow up and provided patient with direct contact information for care management team . Reviewed scheduled/upcoming provider appointments including: OB follow up on 04/16/20 . Advised patient, providing education and  rationale, to check cbg QID and record, calling OB provider for findings outside established parameters.  Confirmed that patient is monitoring QID . Patient unable to speak with [facility] pharmacist (unable to connect by phone) but did speak with community pharmacist re: obtaining syringes and insulin regimen  Patient Self Care Activities:  . UNABLE to independently manage insulin-has questions for pharmacist . Self administers oral medications as prescribed . Self administers insulin as prescribed . Attends all scheduled provider appointments . Checks blood sugars as prescribed and utilize hyper and hypoglycemia protocol as needed . Adheres to prescribed ADA/carb modified  Please see past updates related to this goal by clicking on the "Past Updates" button in the selected goal         Please see education materials related to Carb Modified Diet and Symptoms of Hypoglycemia/Hyperglycemia provided by MyChart link.  Patient verbalizes understanding of instructions provided today.   The Managed Medicaid care management team will reach out to the patient again over the next 7 days.   [place] Management Coordinator Direct Dial:  [phone]  Fax: [phone]

## 2020-04-16 ENCOUNTER — Ambulatory Visit (INDEPENDENT_AMBULATORY_CARE_PROVIDER_SITE_OTHER): Payer: BLUE CROSS/BLUE SHIELD | Admitting: Obstetrics and Gynecology

## 2020-04-16 ENCOUNTER — Encounter: Payer: Self-pay | Admitting: Obstetrics and Gynecology

## 2020-04-16 ENCOUNTER — Encounter (HOSPITAL_COMMUNITY): Payer: Self-pay | Admitting: Obstetrics and Gynecology

## 2020-04-16 ENCOUNTER — Other Ambulatory Visit: Payer: Self-pay

## 2020-04-16 ENCOUNTER — Inpatient Hospital Stay (HOSPITAL_COMMUNITY)
Admission: AD | Admit: 2020-04-16 | Discharge: 2020-04-19 | DRG: 807 | Disposition: A | Discharge status: Home / Self Care | Payer: BLUE CROSS/BLUE SHIELD | Attending: Family Medicine | Admitting: Family Medicine

## 2020-04-16 VITALS — BP 117/80 | HR 109 | Wt 194.0 lb

## 2020-04-16 DIAGNOSIS — Z833 Family history of diabetes mellitus: Secondary | ICD-10-CM

## 2020-04-16 DIAGNOSIS — O288 Other abnormal findings on antenatal screening of mother: Secondary | ICD-10-CM

## 2020-04-16 DIAGNOSIS — Z3A37 37 weeks gestation of pregnancy: Secondary | ICD-10-CM

## 2020-04-16 DIAGNOSIS — Z20822 Contact with and (suspected) exposure to covid-19: Secondary | ICD-10-CM | POA: Diagnosis present

## 2020-04-16 DIAGNOSIS — O24424 Gestational diabetes mellitus in childbirth, insulin controlled: Principal | ICD-10-CM | POA: Diagnosis present

## 2020-04-16 DIAGNOSIS — Z794 Long term (current) use of insulin: Secondary | ICD-10-CM | POA: Diagnosis not present

## 2020-04-16 DIAGNOSIS — Z3480 Encounter for supervision of other normal pregnancy, unspecified trimester: Secondary | ICD-10-CM

## 2020-04-16 DIAGNOSIS — Z8632 Personal history of gestational diabetes: Secondary | ICD-10-CM | POA: Insufficient documentation

## 2020-04-16 DIAGNOSIS — O24414 Gestational diabetes mellitus in pregnancy, insulin controlled: Secondary | ICD-10-CM | POA: Diagnosis present

## 2020-04-16 LAB — CBC
HCT: 36.6 % (ref 36.0–46.0)
Hemoglobin: 12.6 g/dL (ref 12.0–15.0)
MCH: 25.9 pg — ABNORMAL LOW (ref 26.0–34.0)
MCHC: 34.4 g/dL (ref 30.0–36.0)
MCV: 75.3 fL — ABNORMAL LOW (ref 80.0–100.0)
Platelets: 233 10*3/uL (ref 150–400)
RBC: 4.86 MIL/uL (ref 3.87–5.11)
RDW: 15.6 % — ABNORMAL HIGH (ref 11.5–15.5)
WBC: 10.9 10*3/uL — ABNORMAL HIGH (ref 4.0–10.5)
nRBC: 0.2 % (ref 0.0–0.2)

## 2020-04-16 LAB — TYPE AND SCREEN
ABO/RH(D): AB POS
Antibody Screen: NEGATIVE

## 2020-04-16 LAB — GLUCOSE, CAPILLARY
Glucose-Capillary: 69 mg/dL — ABNORMAL LOW (ref 70–99)
Glucose-Capillary: 96 mg/dL (ref 70–99)

## 2020-04-16 LAB — SARS CORONAVIRUS 2 BY RT PCR (HOSPITAL ORDER, PERFORMED IN ~~LOC~~ HOSPITAL LAB): SARS Coronavirus 2: NEGATIVE

## 2020-04-16 MED ORDER — ACETAMINOPHEN 325 MG PO TABS: 650.0000 mg | ORAL_TABLET | ORAL | Status: DC | PRN

## 2020-04-16 MED ORDER — LIDOCAINE HCL (PF) 1 % IJ SOLN: 30.0000 mL | INTRAMUSCULAR | Status: DC | PRN

## 2020-04-16 MED ORDER — LACTATED RINGERS IV SOLN: INTRAVENOUS | Status: DC

## 2020-04-16 MED ORDER — ONDANSETRON HCL 4 MG/2ML IJ SOLN: 4.0000 mg | Freq: Four times a day (QID) | INTRAMUSCULAR | Status: DC | PRN

## 2020-04-16 MED ORDER — FENTANYL CITRATE (PF) 100 MCG/2ML IJ SOLN: 50.0000 ug | INTRAMUSCULAR | Status: DC | PRN

## 2020-04-16 MED ORDER — SOD CITRATE-CITRIC ACID 500-334 MG/5ML PO SOLN: 30.0000 mL | ORAL | Status: DC | PRN

## 2020-04-16 MED ORDER — OXYTOCIN-SODIUM CHLORIDE 30-0.9 UT/500ML-% IV SOLN
2.5000 [IU]/h | INTRAVENOUS | Status: DC
  Administered 2020-04-17: 2.5 [IU]/h via INTRAVENOUS

## 2020-04-16 MED ORDER — TERBUTALINE SULFATE 1 MG/ML IJ SOLN: 0.2500 mg | Freq: Once | INTRAMUSCULAR | Status: DC | PRN

## 2020-04-16 MED ORDER — OXYTOCIN-SODIUM CHLORIDE 30-0.9 UT/500ML-% IV SOLN
1.0000 m[IU]/min | INTRAVENOUS | Status: DC
  Administered 2020-04-16: 2 m[IU]/min via INTRAVENOUS
  Filled 2020-04-16: qty 500

## 2020-04-16 MED ORDER — OXYTOCIN BOLUS FROM INFUSION
333.0000 mL | Freq: Once | INTRAVENOUS | Status: AC
  Administered 2020-04-17: 333 mL via INTRAVENOUS

## 2020-04-16 MED ORDER — LACTATED RINGERS IV SOLN: 500.0000 mL | INTRAVENOUS | Status: DC | PRN

## 2020-04-16 NOTE — Patient Instructions (Signed)

## 2020-04-16 NOTE — Progress Notes (Signed)
Tanya Mccullough MRN: [id number]  Subjective: -Patient resting in bed.  Mother and FOB at bedside. Patient reports some fear regarding process. Support persons with questions regarding process.   Objective: BP 132/88   Pulse 87   Temp 99 F (37.2 C) (Oral)   Resp 18   Ht 5\' 7"  (1.702 m)   Wt 88.3 kg   LMP 08/01/2019 (Exact Date)   SpO2 100%   BMI 30.49 kg/m  No intake/output data recorded. No intake/output data recorded.  Fetal Monitoring: FHT: 145 bpm, Mod Var, -Decels, +Accels UC: Irregular, 3-66min    Vaginal Exam: SVE:   Dilation: 5 Effacement (%): Thick Station: Ballotable Exam by:: [email or web address], CNM Membranes:Intact Internal Monitors: None  Augmentation/Induction: Pitocin:Ordered  Cytotec: None  Assessment:  IUP at 37 weeks Cat I FT  IOL GDM-A2  Plan: -Initial CBG 69. Will check every 2 hrs, but will confirm with Dr. Gerrit Heck -Addressed support persons questions regarding induction process particularly pitocin usage.   -Patient reassured that she will do fine regarding labor/induction process. -Informed that this provider will be giving report to M.Adrian Blackwater, CNM and/or A. Goswick, MD.  -Pitocin ordered at 2x2 mUn/min.  Nurse updated on POC.  Instructed to initiate and increase by 72mUn/min if necessary or hold if patient not tolerating well. -Continue other mgmt as ordered  0m, CNM Advanced Practice Provider, [facility] 04/16/2020, 7:23 PM  Addendum 7:58 PM Dr. 04/18/2020 agreeable with BS Q 2 hrs.   Adrian Blackwater MSN, CNM Advanced Practice Provider, [facility] for [facility]

## 2020-04-16 NOTE — Progress Notes (Signed)
Subjective:  Tanya Mccullough is a 29 y.o. G3P2002 at [redacted]w[redacted]d being seen today for ongoing prenatal care.  She is currently monitored for the following issues for this high-risk pregnancy and has Supervision of other normal pregnancy, antepartum; Hyperglycemia in pregnancy; Preterm uterine contractions in third trimester, antepartum; Hyperglycemia during pregnancy; and Insulin controlled gestational diabetes mellitus (GDM) in third trimester on their problem list.  GDM: Patient taking Humalog and NPH.  Reports no hypoglycemic episodes.  Tolerating medication well Fasting: Only 5 recordings in the past 10 days.  4/5 recordings are abnormal, ranging from 84-104 2hr PP: Only 5 recordings in the past 10 days.  1/5 recordings are abnormal, ranging from 81-127   Only 8 recordings in the past 10 days.  4/8 recordings are abnormal, ranging from 96-199   Only 7 recordings in the past 10 days.  4/7 recordings are abnormal, ranging from 108-150  Patient reports occasional contractions.  Contractions: Irregular. Vag. Bleeding: None.  Movement: Present. Denies leaking of fluid.   The following portions of the patient's history were reviewed and updated as appropriate: allergies, current medications, past family history, past medical history, past social history, past surgical history and problem list. Problem list updated.  Objective:   Vitals:   04/16/20 1348  BP: 117/80  Pulse: (!) 109  Weight: 194 lb (88 kg)    Fetal Status: Fetal Heart Rate (bpm): 146 Fundal Height: 41 cm Movement: Present  Presentation: Vertex  General:  Alert, oriented and cooperative. Patient is in no acute distress.  Skin: Skin is warm and dry. No rash noted.   Cardiovascular: Normal heart rate noted  Respiratory: Normal respiratory effort, no problems with respiration noted  Abdomen: Soft, gravid, appropriate for gestational age. Pain/Pressure: Present     Pelvic: Vag. Bleeding: None     Cervical exam performed  Dilation: 1 Effacement (%): 60 Station: -3  Extremities: Normal range of motion.  Edema: None  Mental Status: Normal mood and affect. Normal behavior. Normal judgment and thought content.   NST:  150s, moderate variability, variable decelerations Urinalysis:      Assessment and Plan:  Pregnancy: G3P2002 at [redacted]w[redacted]d  1. Supervision of other normal pregnancy, antepartum   2. Insulin controlled gestational diabetes mellitus (GDM) in third trimester - Poorly controlled, on insulin - NST today non-reactive with variable decelerations.  - Will send to [facility] triage for continued monitoring and possible induction of labor. - [place] MFM OB LIMITED; Future  Term labor symptoms and general obstetric precautions including but not limited to vaginal bleeding, contractions, leaking of fluid and fetal movement were reviewed in detail with the patient. Please refer to After Visit Summary for other counseling recommendations.  No follow-ups on file.   Johnny Bridge, MD

## 2020-04-16 NOTE — Progress Notes (Signed)
Pt presents for ROB CBG readings available per pt Fasting 90's, postprandial readings 111-120 per pt  GBS neg

## 2020-04-16 NOTE — Progress Notes (Signed)
Tanya Mccullough MRN: [id number]  Subjective: Patient sitting comfortably on birthing ball at bedside. FOB, pt's mother, and doula Hydrographic surveyor) at bedside. No concerns at this time. Minimal discomfort with contractions.  Objective: BP 111/69   Pulse 88   Temp 98.3 F (36.8 C) (Oral)   Resp 20   Ht 5\' 7"  (1.702 m)   Wt 88.3 kg   LMP 08/01/2019 (Exact Date)   SpO2 100%   BMI 30.49 kg/m  No intake/output data recorded. No intake/output data recorded.  Fetal Monitoring: FHT: 140 bpm, Mod Var, -Decels, +Accels UC: Irregular, q6-8 min  Vaginal Exam: SVE:   Dilation: 5 Effacement (%): Thick Station: Ballotable Exam by:: [email or web address], CNM Membranes: Intact Internal Monitors: None  Augmentation/Induction: Pitocin: started @2008  Cytotec: None  Assessment:  Tanya Mccullough is a 29 year old G3P2002 at [redacted]w[redacted]d presenting for IOL secondary to A2GDM. Currently in augmented, latent labor.  Plan: #Labor: Pt currently in latent labor. Will continue to up-titrate pitocin and plan to recheck cervical exam at [facility] or sooner as clinically indicated. #Pain: declines epidural at this time #FWB: Category 1 strip #GBS negative #A2GDM: Poor control on insulin. Initial CBG 69. Will plan to check BG levels every 2 hrs in active labor.  26, MD OB Fellow, Faculty Practice

## 2020-04-16 NOTE — MAU Note (Signed)
Just left [facility], sent over for further monitoring. (was not spiking like they wanted). Denies pain, bleeding or leaking. Is feeling movement.

## 2020-04-16 NOTE — H&P (Signed)
Tanya Mccullough is a 29 y.o. female, G3P2002 at 37 weeks, presenting for IOL s/t NR NST in office and GDM-A2 on insulin, uncontrolled. Patient receives care at [facility] and was supervised for a high risk pregnancy. Pregnancy and medical history significant for problems as listed below. She was admitted for DKA at shortly after incidental diagnosis of GDM at 34 weeks and started on insulin. She was also given BMZ x 2 for preterm contractions.  Patient  is GBS Negative from 34 week culture and expresses a desire for non-pharmacological methods for pain management.  She is anticipating a female infant and requests PP birth control, but is unsure of method at current.     Patient Active Problem List   Diagnosis Date Noted  . Insulin controlled gestational diabetes mellitus (GDM) in third trimester 04/16/2020  . Hyperglycemia during pregnancy 03/30/2020  . Hyperglycemia in pregnancy 03/26/2020  . Preterm uterine contractions in third trimester, antepartum 03/26/2020  . Supervision of other normal pregnancy, antepartum 10/13/2019    History of present pregnancy:  Last evaluation:  04/16/2020 in office by Dr. Annye English   Nursing Staff Provider  Office Location  [facility] Dating  LMP  Language   ENGLISH Anatomy US  Incomplete > FU normal and complete  Flu Vaccine  Declined 04/16/20  Genetic Screen  NIPS: low risk   AFP: screen negative     TDaP vaccine   02/07/2020 Hgb A1C or  GTT Early  Third trimester nmL   Ref. Range 02/07/2020 08:21  Glucose, 1 hour Latest Ref Range: 65 - 179 mg/dL 976  Glucose, Fasting Latest Ref Range: 65 - 91 mg/dL 80  Glucose, 2 hour Latest Ref Range: 65 - 152 mg/dL 734    Rhogam  N/a   LAB RESULTS   Feeding Plan  BREASTFEED Blood Type --/--/AB POS (08/14 0049)   Contraception  Patch  Antibody NEG (08/14 0049)  Circumcision  YES Rubella 8.81 (03/05 1011) IMMUNE  Pediatrician   [facility]  RPR Non Reactive (06/23 0830)   Support Person   FOB-[place]/MOTHER HBsAg Negative (03/05 1011)   Prenatal Classes N/A HIV Non Reactive (06/23 0830)  BTL Consent N/A GBS Negative/-- (08/11 1404)(For PCN allergy, check sensitivities)   VBAC Consent N/A Pap  Negative + HPV 10/2019    Hgb Electro  Carrier Hemglobin C  BP Cuff  Ordered 10/13/2019 CF Negative    SMA Negative    Waterbirth  [ ]  Class [ ]  Consent [ ]  CNM visit    OB History    Gravida  3   Para  2   Term  2   Preterm  0   AB  0   Living  2     SAB  0   TAB  0   Ectopic  0   Multiple  0   Live Births  2            Past Medical History:  Diagnosis Date  . Migraines   . Vaginal Pap smear, abnormal    Past Surgical History:  Procedure Laterality Date  . CHOLECYSTECTOMY    . GALLBLADDER SURGERY    . TONSILECTOMY, ADENOIDECTOMY, BILATERAL MYRINGOTOMY AND TUBES     Family History: family history includes ADD / ADHD in her son; Asthma in her son; Cancer in her maternal aunt; Diabetes in her maternal grandmother; Healthy in her mother; Hypertension in her maternal aunt and maternal grandmother; Migraines in her cousin; Other in her father.  Social History:  reports that she has never smoked. She has never used smokeless tobacco. She reports previous alcohol use. She reports that she does not use drugs.   Prenatal Transfer Tool  Maternal Diabetes: Yes:  Diabetes Type:  Insulin/Medication controlled Genetic Screening: Normal Maternal Ultrasounds/Referrals: Normal Fetal Ultrasounds or other Referrals:  None Maternal Substance Abuse:  No Significant Maternal Medications:  Meds include: Other: Insulin Significant Maternal Lab Results: Group B Strep negative   Maternal Assessment:  ROS: +Contractions, -LOF, -Vaginal Bleeding, +Fetal Movement  All other systems reviewed and negative.    Allergies  Allergen Reactions  . Prochlorperazine Other (See Comments)    Lip twitching       Blood pressure 119/73, pulse 91, temperature 98.2 F (36.8 C),  temperature source Oral, resp. rate 18, height 5\' 7"  (1.702 m), weight 88.3 kg, last menstrual period 08/01/2019, SpO2 100 %.  Physical Exam Constitutional:      Appearance: Normal appearance.  HENT:     Head: Normocephalic and atraumatic.  Eyes:     Conjunctiva/sclera: Conjunctivae normal.  Pulmonary:     Effort: Pulmonary effort is normal. No respiratory distress.     Breath sounds: Normal breath sounds.  Abdominal:     Comments: Gravid, Appears LGA  Neurological:     Mental Status: She is alert and oriented to person, place, and time.  Psychiatric:        Mood and Affect: Mood normal.        Behavior: Behavior normal.        Thought Content: Thought content normal.     Fetal Assessment: Leopolds:  -Pelvis: Proven to 8lbs -EFW: 7 3/4 lbs -Presentation: Asynclitic, Confirmed Vertex by J.Stinson on BSUS.  FHR: 145 bpm, Mod Var, + Occasional Variable Decel, + 15x15 Accels UCs:  Irregular   Assessment IUP at 37 weeks Cat I FT GDM-A2  S/P BMZ Dosing GBS Negative  Plan: Admit to 08/03/2019  Routine Labor and Delivery Orders per Protocol In room to complete assessment and discuss POC: Discussed induction/augmentation of labor with pitocin dosing initially. Given encouragement and reassurance of desire to labor without medications. Will get initial CBG and monitor based on results. Dr. [employer] aware.  Manus Rudd, MSN 04/16/2020, 4:52 PM

## 2020-04-17 ENCOUNTER — Encounter (HOSPITAL_COMMUNITY): Payer: Self-pay | Admitting: Family Medicine

## 2020-04-17 ENCOUNTER — Other Ambulatory Visit (HOSPITAL_COMMUNITY): Payer: BLUE CROSS/BLUE SHIELD

## 2020-04-17 DIAGNOSIS — Z794 Long term (current) use of insulin: Secondary | ICD-10-CM

## 2020-04-17 DIAGNOSIS — Z3A37 37 weeks gestation of pregnancy: Secondary | ICD-10-CM

## 2020-04-17 DIAGNOSIS — O24424 Gestational diabetes mellitus in childbirth, insulin controlled: Secondary | ICD-10-CM

## 2020-04-17 LAB — GLUCOSE, CAPILLARY
Glucose-Capillary: 100 mg/dL — ABNORMAL HIGH (ref 70–99)
Glucose-Capillary: 73 mg/dL (ref 70–99)
Glucose-Capillary: 77 mg/dL (ref 70–99)

## 2020-04-17 LAB — RPR: RPR Ser Ql: NONREACTIVE

## 2020-04-17 MED ORDER — COCONUT OIL OIL: 1.0000 "application " | TOPICAL_OIL | Status: DC | PRN

## 2020-04-17 MED ORDER — TETANUS-DIPHTH-ACELL PERTUSSIS 5-2.5-18.5 LF-MCG/0.5 IM SUSP: 0.5000 mL | Freq: Once | INTRAMUSCULAR | Status: DC

## 2020-04-17 MED ORDER — ONDANSETRON HCL 4 MG/2ML IJ SOLN: 4.0000 mg | INTRAMUSCULAR | Status: DC | PRN

## 2020-04-17 MED ORDER — SIMETHICONE 80 MG PO CHEW: 80.0000 mg | CHEWABLE_TABLET | ORAL | Status: DC | PRN

## 2020-04-17 MED ORDER — BETAMETHASONE SOD PHOS & ACET 6 (3-3) MG/ML IJ SUSP
12.0000 mg | Freq: Once | INTRAMUSCULAR | Status: DC
  Filled 2020-04-17: qty 2

## 2020-04-17 MED ORDER — DIPHENHYDRAMINE HCL 25 MG PO CAPS: 25.0000 mg | ORAL_CAPSULE | Freq: Four times a day (QID) | ORAL | Status: DC | PRN

## 2020-04-17 MED ORDER — IBUPROFEN 600 MG PO TABS
600.0000 mg | ORAL_TABLET | Freq: Four times a day (QID) | ORAL | Status: DC
  Administered 2020-04-17 – 2020-04-19 (×9): 600 mg via ORAL
  Filled 2020-04-17 (×9): qty 1

## 2020-04-17 MED ORDER — WITCH HAZEL-GLYCERIN EX PADS: 1.0000 "application " | MEDICATED_PAD | CUTANEOUS | Status: DC | PRN

## 2020-04-17 MED ORDER — SENNOSIDES-DOCUSATE SODIUM 8.6-50 MG PO TABS
2.0000 | ORAL_TABLET | ORAL | Status: DC
  Administered 2020-04-17 – 2020-04-18 (×2): 2 via ORAL
  Filled 2020-04-17 (×2): qty 2

## 2020-04-17 MED ORDER — DIBUCAINE (PERIANAL) 1 % EX OINT: 1.0000 "application " | TOPICAL_OINTMENT | CUTANEOUS | Status: DC | PRN

## 2020-04-17 MED ORDER — PRENATAL MULTIVITAMIN CH
1.0000 | ORAL_TABLET | Freq: Every day | ORAL | Status: DC
  Administered 2020-04-17 – 2020-04-19 (×3): 1 via ORAL
  Filled 2020-04-17 (×3): qty 1

## 2020-04-17 MED ORDER — ONDANSETRON HCL 4 MG PO TABS: 4.0000 mg | ORAL_TABLET | ORAL | Status: DC | PRN

## 2020-04-17 MED ORDER — ACETAMINOPHEN 325 MG PO TABS
650.0000 mg | ORAL_TABLET | ORAL | Status: DC | PRN
  Administered 2020-04-17 – 2020-04-19 (×3): 650 mg via ORAL
  Filled 2020-04-17 (×3): qty 2

## 2020-04-17 MED ORDER — BENZOCAINE-MENTHOL 20-0.5 % EX AERO: 1.0000 "application " | INHALATION_SPRAY | CUTANEOUS | Status: DC | PRN

## 2020-04-17 NOTE — Progress Notes (Signed)
Tanya Mccullough MRN: [id number]  Subjective: Patient sitting on edge of bed. Endorsing increasing intensity with contractions. Pt requests AROM at this time.  Objective: BP 122/78   Pulse 90   Temp 98.7 F (37.1 C) (Oral)   Resp 18   Ht 5\' 7"  (1.702 m)   Wt 88.3 kg   LMP 08/01/2019 (Exact Date)   SpO2 100%   BMI 30.49 kg/m  No intake/output data recorded. No intake/output data recorded.  Fetal Monitoring: FHT: 140 bpm, Mod Var, several variable decels, +Accels, Category II UC: q2-41min  Vaginal Exam: SVE:   Dilation: 8 Effacement (%): 70 Station: 0 Exam by:: Dr. [email or web address] Membranes: AROM-clear @0410  Internal Monitors: None  Augmentation/Induction: Pitocin: started @2008  Cytotec: None  Assessment:  Tanya Mccullough is a 29 year old G3P2002 at [redacted]w[redacted]d presenting for IOL secondary to A2GDM. Currently in augmented, active labor.  Plan: #Labor: Pt progressing well on pitocin. S/p AROM for clear fluid per pt request.  #Pain: declines epidural at this time #FWB: Category II strip given several variable decels s/p AROM but reassuringly rapid recovery with decels and good variability with +accels. #GBS negative #A2GDM: Poor control on insulin. Initial CBG 69. Will plan to check BG levels every 2 hrs in active labor. Most recent BG checks: 96 >73.  Hollice Espy, MD OB Fellow, Faculty Practice

## 2020-04-17 NOTE — Lactation Note (Signed)
This note was copied from a baby's chart. Lactation Consultation Note Baby 16 hrs old. Mom called for latch assistance. Mom stated baby BF well earlier w/LC assisting in latch. Mom has short shaft very compressible nipples. Baby cueing some, not very interested in BF. Squirming around at the breast will not suck. Baby belched then acting as if going to have emesis but didn't.  Baby slightly jittery. Mom stated no difference in jitteriness.  Hand expressed 12ml colostrum. Baby took 1 ml. Left 1 ml colostrum in container at bedside. Milk storage reviewed.  Shells given to wear in am and hand pump for pre-pumping to evert nipple if needed for latching. Mom is semi flat at rest. Encouraged to call for assistance as needed.   Patient Name: Tanya Mccullough IWPYK'D Date: 04/17/2020 Reason for consult: Mother's request;Early term 37-38.6wks Type of Endocrine Disorder?: Diabetes   Maternal Data Has patient been taught Hand Expression?: Yes  Feeding Feeding Type: Breast Milk  LATCH Score Latch: Too sleepy or reluctant, no latch achieved, no sucking elicited.  Audible Swallowing: None  Type of Nipple: Everted at rest and after stimulation (short shaft)  Comfort (Breast/Nipple): Soft / non-tender  Hold (Positioning): Full assist, staff holds infant at breast  LATCH Score: 4  Interventions Interventions: Breast feeding basics reviewed;Breast compression;Adjust position;Assisted with latch;Hand pump;Skin to skin;Support pillows;Breast massage;Position options;Hand express;Expressed milk;Pre-pump if needed;Shells  Lactation Tools Discussed/Used     Consult Status Consult Status: Follow-up Date: 04/17/20 Follow-up type: In-patient    Charyl Dancer 04/17/2020, 10:53 PM

## 2020-04-17 NOTE — Progress Notes (Signed)
Cheyne Bungert MRN: [id number]  Subjective: Patient lying comfortably in bed. Reports increased intensity with contractions.   Objective: BP (!) 99/49   Pulse 85   Temp 98.7 F (37.1 C) (Oral)   Resp 18   Ht 5\' 7"  (1.702 m)   Wt 88.3 kg   LMP 08/01/2019 (Exact Date)   SpO2 100%   BMI 30.49 kg/m  No intake/output data recorded. No intake/output data recorded.  Fetal Monitoring: FHT: 135 bpm, Mod Var, -Decels, +Accels UC: q2-30min  Vaginal Exam: SVE:   Dilation: 6 Effacement (%): 50 Station: 0 Exam by:: Dr. [email or web address] Membranes: Intact Internal Monitors: None  Augmentation/Induction: Pitocin: started @2008  Cytotec: None  Assessment:  Ms. Knupp is a 29 year old G3P2002 at [redacted]w[redacted]d presenting for IOL secondary to A2GDM. Currently in augmented, latent labor.  Plan: #Labor: Pt now in active labor with up-titration of pitocin. Progressing well. #Pain: declines epidural at this time #FWB: Category 1 strip #GBS negative #A2GDM: Poor control on insulin. Initial CBG 69. Will plan to check BG levels every 2 hrs in active labor.  26, MD OB Fellow, Faculty Practice

## 2020-04-17 NOTE — Lactation Note (Signed)
This note was copied from a baby's chart. Lactation Consultation Note  Patient Name: Tanya Mccullough ALPFX'T Date: 04/17/2020   [name] now 43 hours old.  Mom reports she tried to breastfeed her last two babies for two weeks.  Mom reports that her breasts got too hard with them both and she couldn't take it.  Reviewed with moms how many moms have more milk than their baby can take in the beginning but that mom and baby learn each other and that moms body learns what baby needs.  Discussed that some moms may need to add pumping and/or hand expression during this time if they are too uncomfortable.  Discussed with mom that most moms at about a month their breasts start to soften and they usually are not uncomfortable much anymore.   Urged mom to offer the breast based on cues and 8-12 or more times day. Mom reprots she tried to feed him earlier and he wouldn't latch.  Mom has only tried the fooot ball.  Used modified cross cradle hold and infant latched well.  Left mom and baby breastfeeding.  Reviewed and gave [facility] Consultation breastfeeding handout.  Urged mom to call lactation as needed  Maternal Data    Feeding    LATCH Score                   Interventions    Lactation Tools Discussed/Used     Consult Status      Neomia Dear 04/17/2020, 10:29 PM

## 2020-04-17 NOTE — Discharge Summary (Addendum)
Postpartum Discharge Summary      Patient Name: Tanya Mccullough DOB: [birth year] MRN: [id number]  Date of admission: 04/16/2020 Delivery date:04/17/2020  Delivering provider: Randa Ngo  Date of discharge: 04/19/2020  Admitting diagnosis: Indication for care or intervention in labor or delivery [O75.9] Intrauterine pregnancy: [redacted]w[redacted]d     Secondary diagnosis:  Principal Problem:   Vaginal delivery Active Problems:   Insulin controlled gestational diabetes mellitus (GDM) in third trimester   Indication for care or intervention in labor or delivery  Additional problems: as noted above   Discharge diagnosis: Term Pregnancy Delivered                                              Post partum procedures:none Augmentation: AROM and Pitocin Complications: None  Hospital course: Induction of Labor With Vaginal Delivery   29 y.o. yo [phone] at [redacted]w[redacted]d was admitted to the hospital 04/16/2020 for induction of labor.  Indication for induction: A2 DM.  Patient had an uncomplicated labor course as follows: Membrane Rupture Time/Date: 4:10 AM ,04/17/2020   Delivery Method:Vaginal, Spontaneous  Episiotomy: None  Lacerations:  None  Details of delivery can be found in separate delivery note.  Patient had a routine postpartum course. Patient is discharged home 04/19/20.  Newborn Data: Birth date:04/17/2020  Birth time:5:56 AM  Gender:Female  Living status:Living  Apgars:8 ,9  Weight:3164 g   Magnesium Sulfate received: No BMZ received: No Rhophylac:N/A MMR:N/A T-DaP:Given prenatally Flu: No Transfusion:No  Physical exam  Vitals:   04/18/20 0525 04/18/20 1434 04/18/20 2013 04/19/20 0522  BP: 119/70 114/79 119/82 113/61  Pulse: 70 74 66 61  Resp: $Remo'16 16 15 16  'JOnHf$ Temp: 98.3 F (36.8 C) 98.1 F (36.7 C) 98 F (36.7 C) 98 F (36.7 C)  TempSrc: Oral Oral Oral Oral  SpO2:  100% 100% 99%  Weight:      Height:       General: alert, cooperative and no distress Lochia:  appropriate Uterine Fundus: firm Incision: N/A DVT Evaluation: No evidence of DVT seen on physical exam. Negative Homan's sign. No cords or calf tenderness. Labs: Lab Results  Component Value Date   WBC 10.9 (H) 04/16/2020   HGB 12.6 04/16/2020   HCT 36.6 04/16/2020   MCV 75.3 (L) 04/16/2020   PLT 233 04/16/2020   CMP Latest Ref Rng & Units 04/18/2020  Glucose 70 - 99 mg/dL 81  BUN 6 - 20 mg/dL -  Creatinine 0.44 - 1.00 mg/dL -  Sodium 135 - 145 mmol/L -  Potassium 3.5 - 5.1 mmol/L -  Chloride 98 - 111 mmol/L -  CO2 22 - 32 mmol/L -  Calcium 8.9 - 10.3 mg/dL -  Total Protein 6.5 - 8.1 g/dL -  Total Bilirubin 0.3 - 1.2 mg/dL -  Alkaline Phos 38 - 126 U/L -  AST 15 - 41 U/L -  ALT 0 - 44 U/L -   Edinburgh Score: Edinburgh Postnatal Depression Scale Screening Tool 04/17/2020  I have been able to laugh and see the funny side of things. 0  I have looked forward with enjoyment to things. 0  I have blamed myself unnecessarily when things went wrong. 1  I have been anxious or worried for no good reason. 0  I have felt scared or panicky for no good reason. 0  Things have been getting on  top of me. 0  I have been so unhappy that I have had difficulty sleeping. 0  I have felt sad or miserable. 0  I have been so unhappy that I have been crying. 0  The thought of harming myself has occurred to me. 0  Edinburgh Postnatal Depression Scale Total 1     After visit meds:  Allergies as of 04/19/2020      Reactions   Prochlorperazine Other (See Comments)   Lip twitching      Medication List    STOP taking these medications   Accu-Chek Nano SmartView w/Device Kit   Accu-Chek SmartView test strip Generic drug: glucose blood   Accu-Chek Softclix Lancets lancets   Blood Pressure Kit Devi   cyclobenzaprine 10 MG tablet Commonly known as: FLEXERIL   docusate sodium 100 MG capsule Commonly known as: COLACE   Gojji Weight Scale Misc   insulin lispro 100 UNIT/ML  KwikPen Commonly known as: HumaLOG KwikPen   insulin NPH Human 100 UNIT/ML injection Commonly known as: HumuLIN N   Insulin Pen Needle 32G X 4 MM Misc   Insulin Syringe-Needle U-100 25G X 5/8" 1 ML Misc   Medela Double Breast Pump Misc   Prenatal Gummies/DHA & FA 0.4-32.5 MG Chew     TAKE these medications   acetaminophen 500 MG tablet Commonly known as: TYLENOL Take 1,000 mg by mouth every 6 (six) hours as needed for mild pain or headache.   ibuprofen 600 MG tablet Commonly known as: ADVIL Take 1 tablet (600 mg total) by mouth every 6 (six) hours.   norethindrone 0.35 MG tablet Commonly known as: MICRONOR Start on the Sunday after baby turns 59 weeks old        Discharge home in stable condition Infant Feeding: Breast Infant Disposition:home with mother Discharge instruction: per After Visit Summary and Postpartum booklet. Activity: Advance as tolerated. Pelvic rest for 6 weeks.  Diet: routine diet Future Appointments: Future Appointments  Date Time Provider [place]  04/23/2020  9:30 AM Debbora Presto, NP [facility] None  05/21/2020  8:45 AM [facility] None  05/21/2020  9:00 AM Cephas Darby, MD [place] None   Follow up Visit:  [facility] Follow up on 05/21/2020.   Specialty: Obstetrics and Gynecology Why: for postpartum appointment Contact information: [address], [place] [phone]               Please schedule this patient for a In person postpartum visit in 4 weeks with the following provider: Any provider. Additional Postpartum F/U:2 hour GTT in 4-6 weeks High risk pregnancy complicated by: P5KDT poorly controlled on insulin Delivery mode:  Vaginal, Spontaneous  Anticipated Birth Control:  Unsure   04/19/2020 Christin Fudge, CNM

## 2020-04-17 NOTE — Progress Notes (Signed)
Tanya Mccullough MRN: [id number]  Subjective: Patient sitting on edge of bed. Endorsing increasing intensity with contractions.  Objective: BP 122/78   Pulse 90   Temp 99.2 F (37.3 C) (Oral)   Resp 18   Ht 5\' 7"  (1.702 m)   Wt 88.3 kg   LMP 08/01/2019 (Exact Date)   SpO2 100%   BMI 30.49 kg/m  No intake/output data recorded. No intake/output data recorded.  Fetal Monitoring: FHT: 135 bpm, Mod Var, -Decels, +Accels UC: q3-33min  Vaginal Exam: SVE:   Dilation: 7 Effacement (%): 70 Station: -1 Exam by:: Dr. [email or web address] Membranes: Intact Internal Monitors: None  Augmentation/Induction: Pitocin: started @2008  Cytotec: None  Assessment:  Tanya Mccullough is a 29 year old G3P2002 at [redacted]w[redacted]d presenting for IOL secondary to A2GDM. Currently in augmented, active labor.  Plan: #Labor: Pt now in active labor with up-titration of pitocin. Progressing well. Will defer AROM s/p shared decision-making with patient. #Pain: declines epidural at this time #FWB: Category 1 strip #GBS negative #A2GDM: Poor control on insulin. Initial CBG 69. Will plan to check BG levels every 2 hrs in active labor. Most recent BG checks: 96 >73.  26, MD OB Fellow, Faculty Practice

## 2020-04-18 ENCOUNTER — Ambulatory Visit: Payer: BLUE CROSS/BLUE SHIELD

## 2020-04-18 LAB — GLUCOSE, RANDOM: Glucose, Bld: 81 mg/dL (ref 70–99)

## 2020-04-18 LAB — GLUCOSE, CAPILLARY: Glucose-Capillary: 73 mg/dL (ref 70–99)

## 2020-04-18 MED ORDER — IBUPROFEN 600 MG PO TABS: 600.0000 mg | ORAL_TABLET | Freq: Four times a day (QID) | ORAL | 0 refills | Status: DC

## 2020-04-18 NOTE — Lactation Note (Signed)
This note was copied from a baby's chart. Lactation Consultation Note  Patient Name: Tanya Alleta Avery GHWEX'Mccullough Date: 04/18/2020 Reason for consult: Follow-up assessment;Early term 37-38.6wks;Infant weight loss Type of Endocrine Disorder?: Diabetes Baby is 10 hours old  As LC entered the room baby latched on the left breast / football / and fully dressed , mom mentioned the baby had been feeding 20 mins , and LC observed 8 mins longer. Baby released and the nipple well rounded .  Baby acting hungry and LC assisted to latch on the right breast football / STS and depth achieved , swallows, and fed 10 mins , released , and then acted hungry so Mom with minimal assist latched STS and baby still feeding.  LC recommended due to the 7 % weight  Loss , prior to every feeding breast massage, hand express, pre-pump with hand pump to prime the milk ducts,  Firm support. Third Latch mom needed minimal assist.  LC stressed the importance of STS feedings until the baby is back to birth weight,gaining steadily and can stay awake for majority of the feeding.  Mom mentioned her breast are fuller today , LC reassured her its a good sign .  Mom also mentioned she had engorgement issues in the past.  LC reviewed sore nipple and engorgement prevention and tx.  Per mom will have a DEBP at home , and storage of breast milk.    Maternal Data Has patient been taught Hand Expression?: Yes  Feeding Feeding Type: Breast Fed  LATCH Score Latch: Grasps breast easily, tongue down, lips flanged, rhythmical sucking.  Audible Swallowing: Spontaneous and intermittent  Type of Nipple: Everted at rest and after stimulation  Comfort (Breast/Nipple): Soft / non-tender  Hold (Positioning): Assistance needed to correctly position infant at breast and maintain latch.  LATCH Score: 9  Interventions Interventions: Breast feeding basics reviewed;Assisted with latch;Skin to skin;Adjust position;Breast compression;Support  pillows;Position options  Lactation Tools Discussed/Used Tools: Pump Breast pump type: Manual Pump Review: Milk Storage   Consult Status Consult Status: Follow-up Date: 04/19/20 Follow-up type: In-patient    Tanya Mccullough 04/18/2020, 11:49 AM

## 2020-04-18 NOTE — Discharge Instructions (Signed)

## 2020-04-18 NOTE — Progress Notes (Signed)
Post Partum Day #1 Subjective: no complaints, up ad lib and tolerating PO; breastfeeding; considering POPs for contraception after all options reviewed that are compatible w/ breastfeeding; desires circ for her son  Objective: Blood pressure 119/70, pulse 70, temperature 98.3 F (36.8 C), temperature source Oral, resp. rate 16, height 5\' 7"  (1.702 m), weight 88.3 kg, last menstrual period 08/01/2019, SpO2 100 %, unknown if currently breastfeeding.  Physical Exam:  General: alert and cooperative Lochia: appropriate Uterine Fundus: firm DVT Evaluation: No evidence of DVT seen on physical exam.  Recent Labs    04/16/20 1714  HGB 12.6  HCT 36.6   Fasting CBG: 81  Assessment/Plan: Plan for discharge tomorrow; circ today when infant has been cleared by peds  CNM Circumcision Counseling Progress Note  Patient desires circumcision for her female infant.  Circumcision procedure details discussed, risks and benefits of procedure were also discussed.  These include but are not limited to: Benefits of circumcision in men include reduction in the rates of urinary tract infection (UTI), penile cancer, some sexually transmitted infections, penile inflammatory and retractile disorders, as well as easier hygiene.  Risks include bleeding , infection, injury of glans which may lead to penile deformity or urinary tract issues, unsatisfactory cosmetic appearance and other potential complications related to the procedure.  It was emphasized that this is an elective procedure.  Patient wants to proceed with circumcision; written informed consent will be obtained.  Will have MD do circumcision when infant is cleared for such by peds.    LOS: 2 days   04/18/20 CNM 04/18/2020, 7:10 AM

## 2020-04-19 ENCOUNTER — Inpatient Hospital Stay (HOSPITAL_COMMUNITY): Payer: BLUE CROSS/BLUE SHIELD

## 2020-04-19 ENCOUNTER — Inpatient Hospital Stay (HOSPITAL_COMMUNITY)
Admission: AD | Admit: 2020-04-19 | Payer: BLUE CROSS/BLUE SHIELD | Source: Home / Self Care | Admitting: Obstetrics and Gynecology

## 2020-04-19 MED ORDER — NORETHINDRONE 0.35 MG PO TABS: ORAL_TABLET | ORAL | 11 refills | Status: DC

## 2020-04-20 ENCOUNTER — Ambulatory Visit: Payer: Self-pay

## 2020-04-20 NOTE — Lactation Note (Signed)
This note was copied from a baby's chart. Lactation Consultation Note  Patient Name: Tanya Mccullough FYBOF'B Date: 04/20/2020 Reason for consult: Early term 37-38.6wks;Hyperbilirubinemia P3, 69 hour ETI female infant on phototherapy,  -10% weight loss. Mom did not  latch infant at the breast or use  DEBP in  the past 6 hours, mom  was only formula feeding infant, through tout the night mom's milk had transition to mature milk and she became engorged.  LC changed a green stool while in room. Mom holding infant, LC did not see latch due mom feeding infant 30 mls of formula prior to [facility] entering the room. LC inquired about infant feeding, per mom  Infant last latched at 2130  pm for the other  two feedings she has only been giving infant formula. Mom has not pumped in past 6 hours, mom is slightly engorged, per mom,  she is starting to feel warm and her breast are becoming hard. LC explained importance of latching infant at breast for every feeding to help establish milk supply and then supplementing with formula. If infant is not being latch mom will need to pump breast to prevent engorgement. LC  assisted mom in alleviating engorgement with reverse pressure softening, hands on pumping with breast massage and hand expression, mom breast became soften and mom pumped 30 mls of EBM. Mom's plan is to: 1. Latch infant at each feeding now that her milk supply is in, next feeding after latching infant at breast she will give infant 30 mls of EBM and then pump afterwards. 2. Mom will continue to BF infant and supplement infant with EBM/ and or formula after every feeding  due to infant high weight loss and infant  being on phototherapy.  3. Mom knows to call RN or LC if she has any questions, concerns or need assistance with latching infant at breast.   Maternal Data    Feeding Feeding Type: Breast Fed Nipple Type: Slow - flow  LATCH Score                   Interventions Interventions:  Hand express;DEBP;Breast massage;Breast compression;Reverse pressure  Lactation Tools Discussed/Used     Consult Status Consult Status: Follow-up Date: 04/20/20 Follow-up type: In-patient    Danelle Earthly 04/20/2020, 3:12 AM

## 2020-04-20 NOTE — Lactation Note (Signed)
This note was copied from a baby's chart. Lactation Consultation Note  Patient Name: Tanya Mccullough [id number] Date: 04/20/2020   Mom/baby to be d/c'd today. Mom was using size 24 flanges, but were too large for her. Size 21 flanges were provided & Mom said this "feels a whole lot better." Mom has a Pump-in-style at home. Increasing suction was discussed.   Mom says infant latches to the L breast better than the R breast. I discussed latching infant to the L breast & then pumping R breast, if infant won't latch to the R breast. Hopefully, infant will soon begin to latch infant to the R breast. Mom knows to palpate (Mom's milk has come to volume). She knows to palpate breast after feeding to ensure milk transfer.   Mom will think about having a lactation appt. Mom has our phone # for post-discharge questions.   Lurline Hare [facility] 04/20/2020, 10:28 AM

## 2020-04-23 ENCOUNTER — Telehealth: Payer: Medicaid Other | Admitting: Family Medicine

## 2020-04-23 ENCOUNTER — Telehealth: Payer: Self-pay | Admitting: Family Medicine

## 2020-04-23 ENCOUNTER — Telehealth: Payer: Self-pay | Admitting: *Deleted

## 2020-04-23 NOTE — Telephone Encounter (Signed)
Patient did not log in to Mychart for 9am appt. Review of chart shows she was discharged from the hospital 9/3 after delivering her baby 9/1. I have left a message on her voicemail that she may reschedule if needed. No visit needed if doing well and not needing migraine management.

## 2020-04-23 NOTE — Progress Notes (Deleted)
PATIENT: Tanya Mccullough DOB: 1991-06-29  REASON FOR VISIT: follow up HISTORY FROM: patient  Virtual Visit via Telephone Note  I connected with [name] on 04/23/20 at  9:30 AM EDT by telephone and verified that I am speaking with the correct person using two identifiers.   I discussed the limitations, risks, security and privacy concerns of performing an evaluation and management service by telephone and the availability of in person appointments. I also discussed with the patient that there may be a patient responsible charge related to this service. The patient expressed understanding and agreed to proceed.   History of Present Illness:  04/23/20 Tanya Mccullough is a 29 y.o. female here today for follow up for migraines. She was previously doing well on Emgality and rizatriptan.    History (copied from my note on 06/29/2019)  Tanya Mccullough is a 29 y.o. female here today for follow up for migraines. She was seen last on 9/1 and doing very well on Emgality. She had a migraine on 9/25 that was unresponsive to rizatriptan. She was given a migraine cocktail of Toradol, Reglan and Decadron. Migraine nearly resolved with treatment. About a month later, she had another migraine that was persistent for 4-5 days. She found relief with benadryl and Sudafed. She has not had any headaches since. Rizatriptan usually helps to abort migraines. She takes naproxen as needed. BP running 120/80's. No new or concerning symptoms.   Medications tried: Emgality, topiramate, gabapentin, diclofenac, Imitrex   History (copied from my note on 04/18/2019)  Tanya Gibsonis a 29 y.o.femalehere today for follow up for migraines. She was started on Emgality and rizatriptan at last visit.She has had complete resolution of her migraines. No headaches in over 2 months. She took rizatriptan once and it worked well. No adverse effects with medications. She is feeling well and  without concerns today.  History (copied from Dr Richrd Humbles note on 01/02/2019)  29 year old female here for evaluation of headaches.  Patient has had headaches since age 29 years old with unilateral throbbing pounding severe headaches. These are associated with nausea, photophobia and phonophobia. Also associated with confusion and slurred speech. These are oftentimes preceded by seeing flashing lights in one eye and unilateral numbness in the face and arm. Headaches can last hours at a time. She was living in [place] previously and tried on Topamax, gabapentin, Imitrex, diclofenac with mild relief. He was having some cognitive side effects with migraine prevention medicines and gradually came off these medicines 5 to 6 months ago.  In last 1 month patient has had increasing, recurrent headaches, with change in severity and features. Now patient having some muscle twitching and daily headaches. Patient has tried Topamax and Imitrex without relief. She went to the emergency room for evaluation. Patient is almost having daily headaches now.   Observations/Objective:  Generalized: Well developed, in no acute distress  Mentation: Alert oriented to time, place, history taking. Follows all commands speech and language fluent   Assessment and Plan:  29 y.o. year old female  has a past medical history of Migraines and Vaginal Pap smear, abnormal. here with  No diagnosis found.  No orders of the defined types were placed in this encounter.   No orders of the defined types were placed in this encounter.    Follow Up Instructions:  I discussed the assessment and treatment plan with the patient. The patient was provided an opportunity to ask questions and all were answered. The patient agreed with the  plan and demonstrated an understanding of the instructions.   The patient was advised to call back or seek an in-person evaluation if the symptoms worsen or if the condition  fails to improve as anticipated.  I provided *** minutes of non-face-to-face time during this encounter.   [name], NP

## 2020-04-23 NOTE — Telephone Encounter (Signed)
Transition Care Management Unsuccessful Follow-up Telephone Call  Date of discharge and from where: 04/19/20, [facility]  Attempts:  1st Attempt  Reason for unsuccessful TCM follow-up call:  Left voice message.  Katrice Valeriano Bain, RN, BSN, CCRN Patient Engagement Center [phone]     

## 2020-04-24 ENCOUNTER — Telehealth: Payer: Self-pay | Admitting: *Deleted

## 2020-04-24 NOTE — Telephone Encounter (Signed)
Contacted patient to complete Transition of Care Assessment: Transition Care Management Follow-up Telephone Call  Date of discharge and from where: 04/19/20, [facility]  How have you been since you were released from the hospital? "Doing well so far"  Any questions or concerns? No  Items Reviewed:  Did the pt receive and understand the discharge instructions provided? Yes   Medications obtained and verified? Yes   Any new allergies since your discharge? No   Dietary orders reviewed? YES  Do you have support at home? Yes family  Functional Questionnaire: (I = Independent and D = Dependent) ADLs: I  Bathing/Dressing- I  Meal Prep- I  Eating- I  Maintaining continence- I  Transferring/Ambulation- I  Managing Meds- I  Follow up appointments reviewed:   PCP Hospital f/u appt confirmed? No Patient to contact the office for [place] [facility] to schedule follow up appt  Specialist Hospital f/u appt confirmed? Yes  Scheduled to see Dr Kelby Aline on 05/21/20 @ 0900.  Are transportation arrangements needed? No   If their condition worsens, is the pt aware to call PCP or go to the Emergency Dept.? YES  Was the patient provided with contact information for the PCP's office or ED? YES  Was to pt encouraged to call back with questions or concerns? YES  Burnard Bunting, RN, BSN, CCRN Patient Engagement Center [phone]

## 2020-05-21 ENCOUNTER — Ambulatory Visit (INDEPENDENT_AMBULATORY_CARE_PROVIDER_SITE_OTHER): Payer: BLUE CROSS/BLUE SHIELD | Admitting: Obstetrics and Gynecology

## 2020-05-21 ENCOUNTER — Other Ambulatory Visit: Payer: BLUE CROSS/BLUE SHIELD

## 2020-05-21 ENCOUNTER — Other Ambulatory Visit: Payer: Self-pay

## 2020-05-21 ENCOUNTER — Encounter: Payer: Self-pay | Admitting: Obstetrics and Gynecology

## 2020-05-21 DIAGNOSIS — O24414 Gestational diabetes mellitus in pregnancy, insulin controlled: Secondary | ICD-10-CM

## 2020-05-21 NOTE — Progress Notes (Signed)
    Post Partum Visit Note  Tanya Mccullough is a 29 y.o. G1P3003 female who presents for a postpartum visit. She is 4 weeks postpartum following a normal spontaneous vaginal delivery.  I have fully reviewed the prenatal and intrapartum course. The delivery was at 37.1 gestational weeks.  Anesthesia: none. Postpartum course has been unremarkable. Baby is doing well. Baby is feeding by bottle - Gerber Soothe Pro. Bleeding no bleeding. Bowel function is normal. Bladder function is normal. Patient is sexually active. Contraception method is progesterone only pill. Postpartum depression screening: negative=2   The pregnancy intention screening data noted above was reviewed. Potential methods of contraception were discussed. The patient elected to proceed with the progesterone only pill.  Patient considering the Nexplanon or the [facility] IUD.  Pamphlets for both given.      The following portions of the patient's history were reviewed and updated as appropriate: allergies, current medications, past family history, past medical history, past social history, past surgical history and problem list.  Review of Systems A comprehensive review of systems was negative.    Objective:  Blood pressure 120/78, pulse 60, height 5\' 7"  (1.702 m), weight 176 lb (79.8 kg), not currently breastfeeding.  General:  alert and cooperative   Breasts:  inspection negative, no nipple discharge or bleeding, no masses or nodularity palpable  Lungs: clear to auscultation bilaterally  Heart:  regular rate and rhythm, S1, S2 normal, no murmur, click, rub or gallop  Abdomen: soft, non-tender; bowel sounds normal; no masses,  no organomegaly   Vulva:  not evaluated  Vagina: not evaluated  Cervix:  not evaluated  Corpus: not examined  Adnexa:  not evaluated  Rectal Exam: Not performed.        Assessment:    Normal postpartum exam. Pap smear not done at today's visit. 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  Patient for repeat pap in March of 2022.  Plan:   Essential components of care per ACOG recommendations:  1.  Mood and well being: Patient with negative depression screening today. Reviewed local resources for support.  - Patient does not use tobacco. - hx of drug use? No   2. Infant care and feeding:  -Patient currently breastmilk feeding? No  -Social determinants of health (SDOH) reviewed in EPIC. No concerns. 3. Sexuality, contraception and birth spacing - Patient does not want a pregnancy in the next year.  Desired family size is unknown children.  - Reviewed forms of contraception in tiered fashion. Patient desired oral progesterone-only contraceptive today.   - Discussed birth spacing of 18 months  4. Sleep and fatigue -Encouraged family/partner/community support of 4 hrs of uninterrupted sleep to help with mood and fatigue  5. Physical Recovery  - Discussed patients delivery and complications of insulin dependent gestational diabetes - Patient had a no degree laceration, perineal healing reviewed. Patient expressed understanding - Patient has urinary incontinence? No  - Patient is safe to resume physical and sexual activity  6.  Health Maintenance - Last pap smear done 10/2019 and was normal with positive HPV.   7. Insulin controlled gestational diabetes - For 2hr GTT today.  11/2019. Darrin Nipper, MD [facility], [facility]

## 2020-05-22 LAB — GLUCOSE TOLERANCE, 2 HOURS
Glucose, 2 hour: 80 mg/dL (ref 65–139)
Glucose, GTT - Fasting: 78 mg/dL (ref 65–99)

## 2020-05-24 ENCOUNTER — Encounter: Payer: Self-pay | Admitting: Obstetrics & Gynecology

## 2020-07-08 NOTE — Progress Notes (Deleted)
PATIENT: Tanya Mccullough DOB: February 17, 1991  REASON FOR VISIT: follow up HISTORY FROM: patient  Virtual Visit via Telephone Note  I connected with [name] on 07/08/20 at  7:45 AM EST by telephone and verified that I am speaking with the correct person using two identifiers.   I discussed the limitations, risks, security and privacy concerns of performing an evaluation and management service by telephone and the availability of in person appointments. I also discussed with the patient that there may be a patient responsible charge related to this service. The patient expressed understanding and agreed to proceed.   History of Present Illness:  07/08/20 Tanya Mccullough is a 29 y.o. Mccullough here today for follow up for migraines. She recently delivered a healthy baby boy (04/17/2020).    History (copied from my note on 06/29/2019)  Tanya Mccullough is a 29 y.o. Mccullough here today for follow up for migraines. She was seen last on 9/1 and doing very well on Emgality. She had a migraine on 9/25 that was unresponsive to rizatriptan. She was given a migraine cocktail of Toradol, Reglan and Decadron. Migraine nearly resolved with treatment. About a month later, she had another migraine that was persistent for 4-5 days. She found relief with benadryl and Sudafed. She has not had any headaches since. Rizatriptan usually helps to abort migraines. She takes naproxen as needed. BP running 120/80's. No new or concerning symptoms.   Medications tried: Emgality, topiramate, gabapentin, diclofenac, Imitrex   History (copied from my note on 04/18/2019)  Tanya Gibsonis a 29 y.o.femalehere today for follow up for migraines. She was started on Emgality and rizatriptan at last visit.She has had complete resolution of her migraines. No headaches in over 2 months. She took rizatriptan once and it worked well. No adverse effects with medications. She is feeling well and  without concerns today.  History (copied from Dr Richrd Humbles note on 01/02/2019)  29 year old Mccullough here for evaluation of headaches.  Patient has had headaches since age [age] years old with unilateral throbbing pounding severe headaches. These are associated with nausea, photophobia and phonophobia. Also associated with confusion and slurred speech. These are oftentimes preceded by seeing flashing lights in one eye and unilateral numbness in the face and arm. Headaches can last hours at a time. She was living in [place] previously and tried on Topamax, gabapentin, Imitrex, diclofenac with mild relief. He was having some cognitive side effects with migraine prevention medicines and gradually came off these medicines 5 to 6 months ago.  In last 1 month patient has had increasing, recurrent headaches, with change in severity and features. Now patient having some muscle twitching and daily headaches. Patient has tried Topamax and Imitrex without relief. She went to the emergency room for evaluation. Patient is almost having daily headaches now.  Observations/Objective:  Generalized: Well developed, in no acute distress  Mentation: Alert oriented to time, place, history taking. Follows all commands speech and language fluent   Assessment and Plan:  Tanya Mccullough  has a past medical history of Gestational diabetes, Migraines, and Vaginal Pap smear, abnormal. here with  No diagnosis found.  No orders of the defined types were placed in this encounter.   No orders of the defined types were placed in this encounter.    Follow Up Instructions:  I discussed the assessment and treatment plan with the patient. The patient was provided an opportunity to ask questions and all were answered. The patient agreed with the  plan and demonstrated an understanding of the instructions.   The patient was advised to call back or seek an in-person evaluation if the symptoms worsen or  if the condition fails to improve as anticipated.  I provided *** minutes of non-face-to-face time during this encounter.   [name], NP

## 2020-07-09 ENCOUNTER — Telehealth: Payer: Medicaid Other | Admitting: Family Medicine

## 2020-07-09 DIAGNOSIS — G4489 Other headache syndrome: Secondary | ICD-10-CM

## 2020-09-22 IMAGING — US US MFM OB FOLLOW-UP
1 series · 14 of 28 positions shown · non-contrast
Comparison: none

[Series 1: us mfm ob follow-up · 14 of 33 slices shown]
[im 2/33]
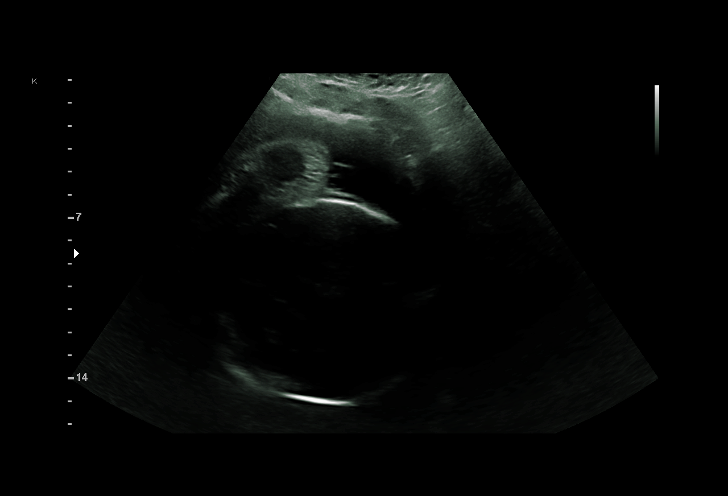
[im 4/33]
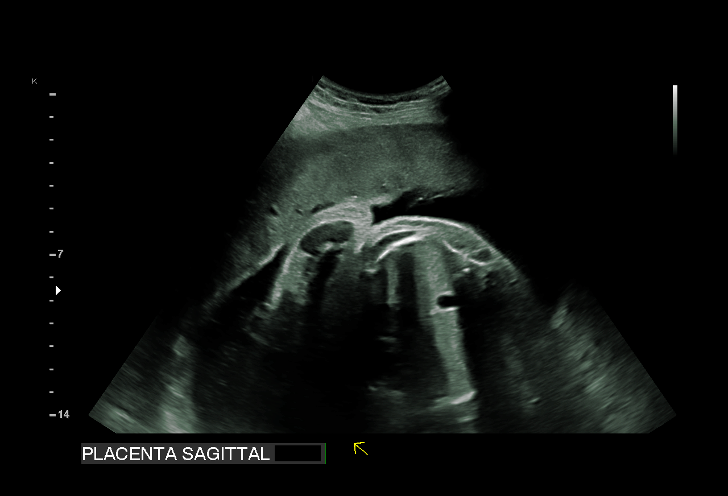
[im 6/33]
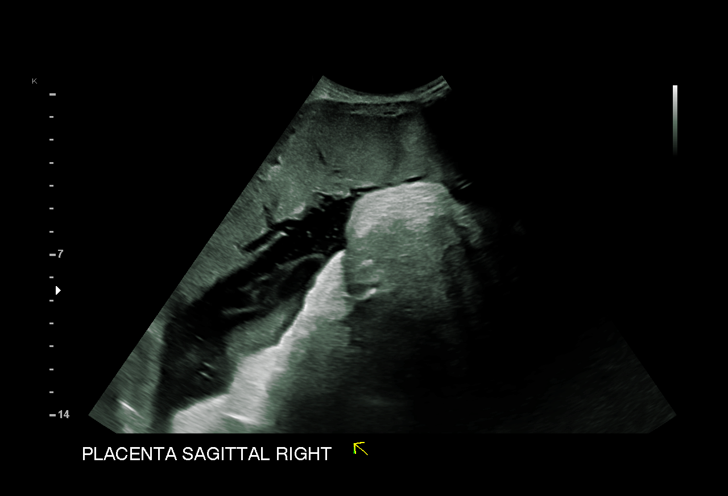
[im 9/33]
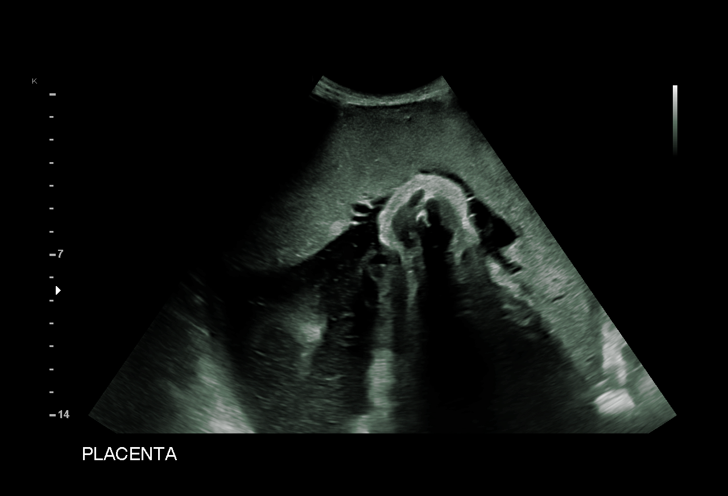
[im 11/33]
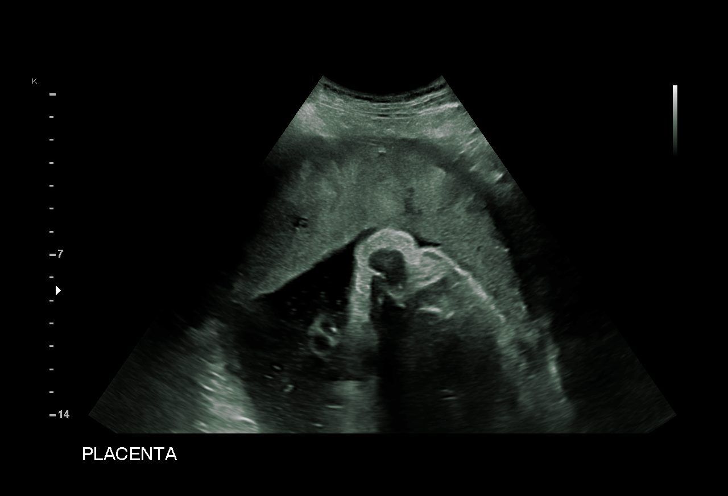
[im 14/33]
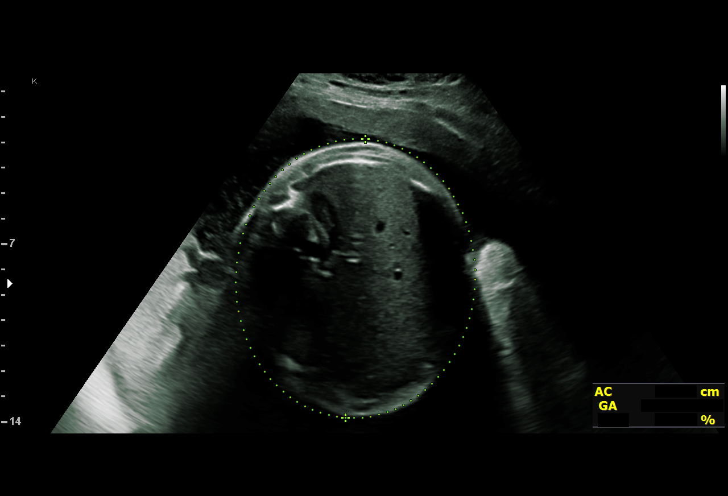
[im 16/33]
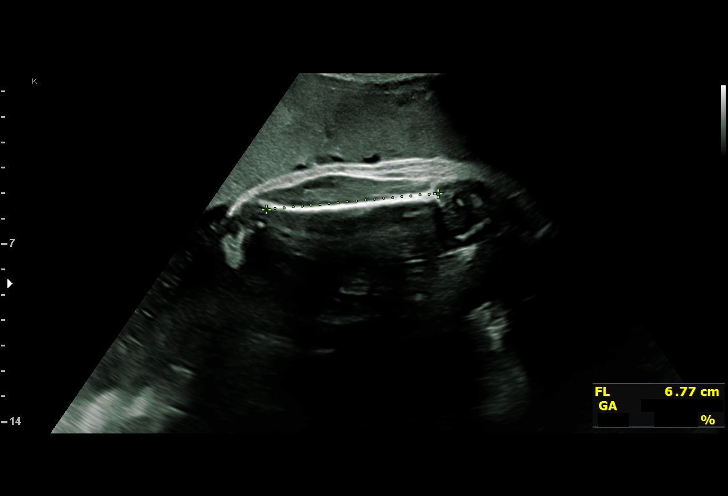
[im 18/33]
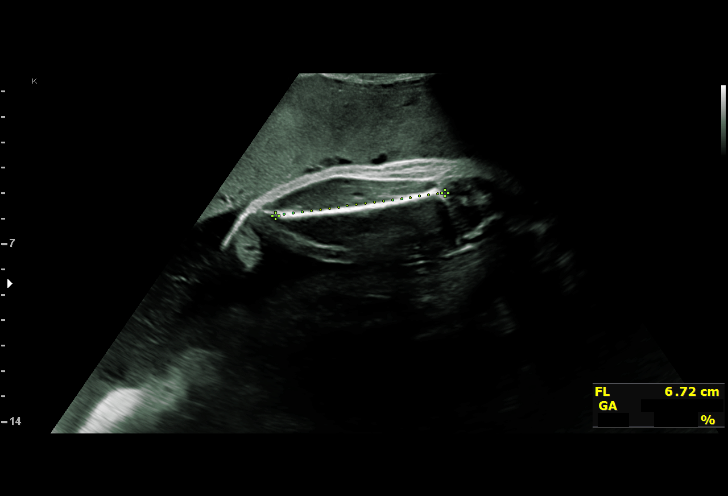
[im 21/33]
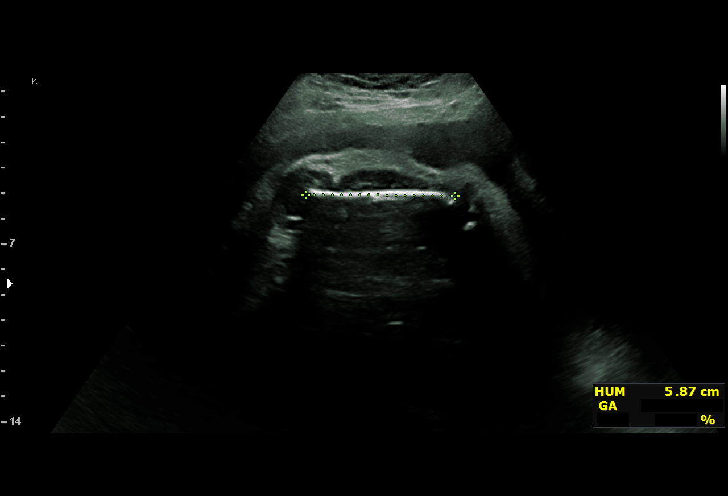
[im 23/33]
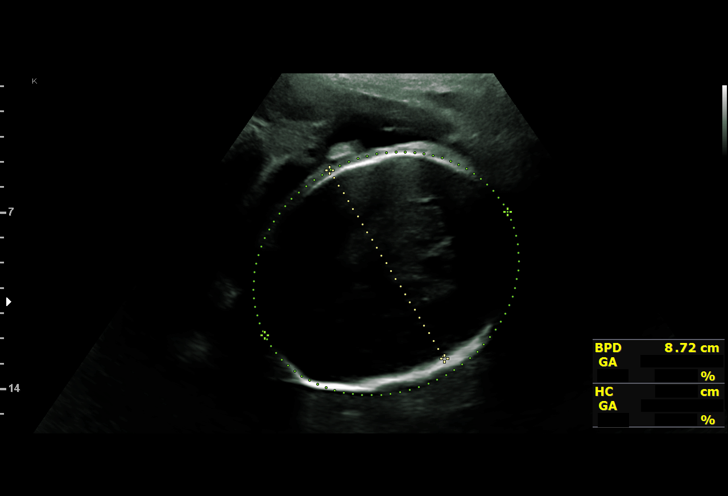
[im 25/33]
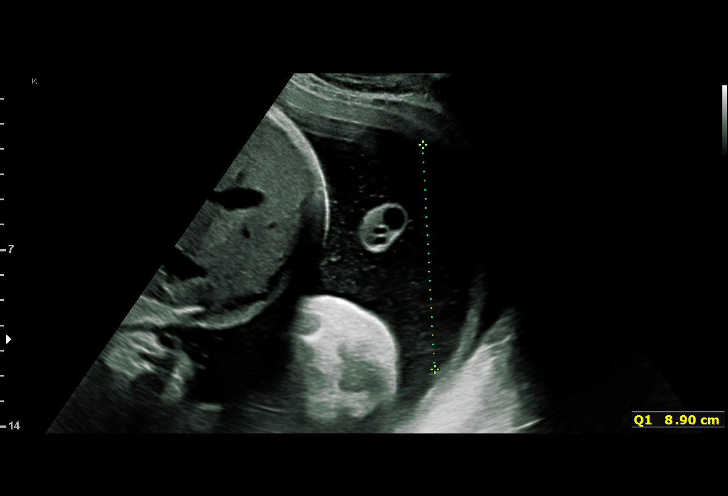
[im 28/33]
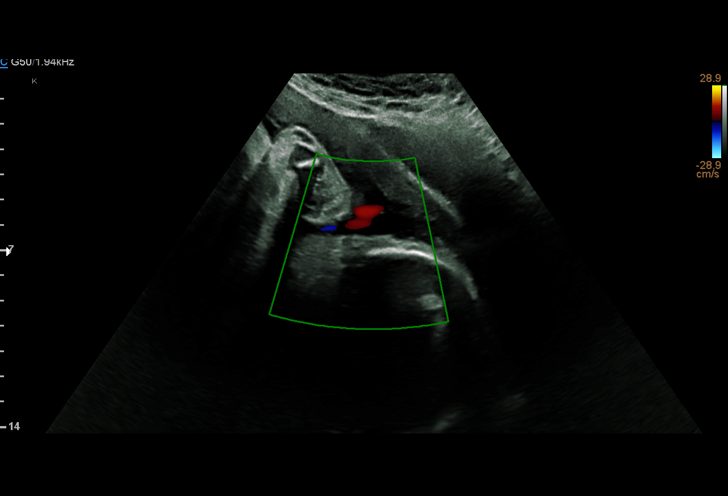
[im 30/33]
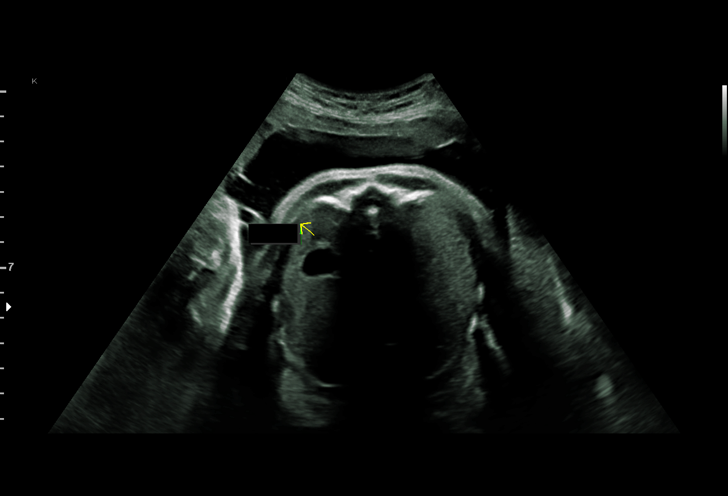
[im 33/33]
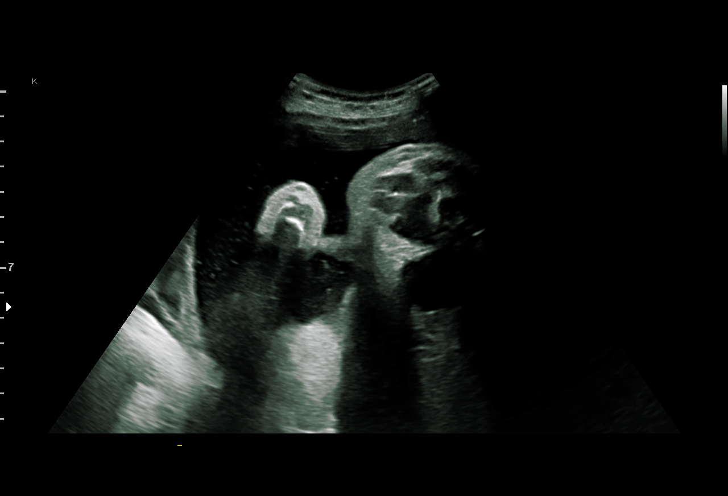

[14 of 28 positions shown; findings below may reference images not displayed]

Indications

 Gestational diabetes in pregnancy,
 unspecified control
 Low risk NIPS,
 Encounter for other antenatal screening
 follow-up
 34 weeks gestation of pregnancy
Fetal Evaluation

 Num Of Fetuses:          1
 Fetal Heart Rate(bpm):   160
 Cardiac Activity:        Observed
 Presentation:            Cephalic
 Placenta:                Anterior

 Amniotic Fluid
 AFI FV:      Within normal limits

 AFI Sum(cm)     %Tile       Largest Pocket(cm)
 20.4            76

 RUQ(cm)                     LUQ(cm)        LLQ(cm)

Biometry

 BPD:      86.8  mm     G. Age:  35w 0d         64  %    CI:        77.55   %    70 - 86
                                                         FL/HC:       21.7  %    20.1 -
 HC:       312   mm     G. Age:  34w 6d         23  %    HC/AC:       0.98       0.93 -
 AC:      319.2  mm     G. Age:  35w 6d         87  %    FL/BPD:      78.0  %    71 - 87
 FL:       67.7  mm     G. Age:  34w 6d         48  %    FL/AC:       21.2  %    20 - 24
 HUM:      58.1  mm     G. Age:  33w 5d         45  %

 Est. FW:    0773   gm   5 lb 14 oz      70  %
OB History

 Gravidity:    3         Term:   2        Prem:   0        SAB:   0
 TOP:          0       Ectopic:  0        Living: 2
Gestational Age

 LMP:           34w 4d        Date:  08/01/19                 EDD:   05/07/20
 U/S Today:     35w 1d                                        EDD:   05/03/20
 Best:          34w 4d     Det. By:  LMP  (08/01/19)          EDD:   05/07/20
Anatomy

 Cranium:               Appears normal         LVOT:                   Previously seen
 Cavum:                 Previously seen        Aortic Arch:            Previously seen
 Ventricles:            Previously seen        Ductal Arch:            Previously seen
 Choroid Plexus:        Previously seen        Diaphragm:              Appears normal
 Cerebellum:            Previously seen        Stomach:                Appears normal, left
                                                                       sided
 Posterior Fossa:       Previously seen        Abdomen:                Previously seen
 Nuchal Fold:           Previously seen        Abdominal Wall:         Previously seen
 Face:                  Orbits and profile     Cord Vessels:           Previously seen
                        previously seen
 Lips:                  Previously seen        Kidneys:                Appear normal
 Palate:                Not well visualized    Bladder:                Appears normal
 Thoracic:              Appears normal         Spine:                  Limited views nl
                                                                       again seen
 Heart:                 Previously seen        Upper Extremities:      Previously seen
 RVOT:                  Previously seen        Lower Extremities:      Previously seen

 Other:  Male gender. Heels prev visualized. Lt hand/5th prev visualized.
Comments

 This patient was seen for a follow up growth scan due to
 uncontrolled gestational diabetes.
 The fetal growth and amniotic fluid level appears appropriate
 for her gestational age.
 She is being admitted for glycemic control.

## 2020-10-02 ENCOUNTER — Ambulatory Visit (INDEPENDENT_AMBULATORY_CARE_PROVIDER_SITE_OTHER): Payer: BLUE CROSS/BLUE SHIELD

## 2020-10-02 ENCOUNTER — Other Ambulatory Visit: Payer: Self-pay

## 2020-10-02 ENCOUNTER — Other Ambulatory Visit (HOSPITAL_COMMUNITY)
Admission: RE | Admit: 2020-10-02 | Discharge: 2020-10-02 | Disposition: A | Discharge status: Home / Self Care | Payer: BLUE CROSS/BLUE SHIELD | Source: Ambulatory Visit | Attending: Obstetrics | Admitting: Obstetrics

## 2020-10-02 VITALS — BP 121/79 | HR 96 | Ht 67.0 in | Wt 178.2 lb

## 2020-10-02 DIAGNOSIS — N898 Other specified noninflammatory disorders of vagina: Secondary | ICD-10-CM | POA: Diagnosis not present

## 2020-10-02 NOTE — Progress Notes (Signed)
SUBJECTIVE:  30 y.o. female complains of vaginal odor and vaginal discharge for approximately 2 weeks. Denies abnormal vaginal bleeding or significant pelvic pain or fever. No UTI symptoms.   LMP: 07/02/20 recent IAB. BCM: Xulane patch OBJECTIVE:  She appears well, afebrile.   ASSESSMENT:  Vaginal Discharge  Vaginal Odor   PLAN:  GC, chlamydia, trichomonas, BVAG, CVAG probe sent to lab. Treatment: To be determined once lab results are received ROV prn if symptoms persist or worsen.

## 2020-10-03 ENCOUNTER — Other Ambulatory Visit: Payer: Self-pay

## 2020-10-03 LAB — CERVICOVAGINAL ANCILLARY ONLY
Bacterial Vaginitis (gardnerella): POSITIVE — AB
Candida Glabrata: NEGATIVE
Candida Vaginitis: NEGATIVE
Chlamydia: NEGATIVE
Comment: NEGATIVE
Comment: NEGATIVE
Comment: NEGATIVE
Comment: NEGATIVE
Comment: NEGATIVE
Comment: NORMAL
Neisseria Gonorrhea: NEGATIVE
Trichomonas: NEGATIVE

## 2020-10-03 MED ORDER — METRONIDAZOLE 500 MG PO TABS: 500.0000 mg | ORAL_TABLET | Freq: Two times a day (BID) | ORAL | 0 refills | Status: DC

## 2020-10-21 NOTE — Progress Notes (Signed)
PATIENT: Tanya Mccullough DOB: 07-16-1991  REASON FOR VISIT: follow up HISTORY FROM: patient  Virtual Visit via Telephone Note  I connected with [name] on 10/21/20 at  7:30 AM EST by telephone and verified that I am speaking with the correct person using two identifiers.   I discussed the limitations, risks, security and privacy concerns of performing an evaluation and management service by telephone and the availability of in person appointments. I also discussed with the patient that there may be a patient responsible charge related to this service. The patient expressed understanding and agreed to proceed.   History of Present Illness:  10/21/20 ALL:    06/29/2019 ALL:  Tanya Mccullough is a 30 y.o. female here today for follow up for migraines. She was seen last on 9/1 and doing very well on Emgality. She had a migraine on 9/25 that was unresponsive to rizatriptan. She was given a migraine cocktail of Toradol, Reglan and Decadron. Migraine nearly resolved with treatment. About a month later, she had another migraine that was persistent for 4-5 days. She found relief with benadryl and Sudafed. She has not had any headaches since. Rizatriptan usually helps to abort migraines. She takes naproxen as needed. BP running 120/80's. No new or concerning symptoms.   Medications tried: Emgality, topiramate, gabapentin, diclofenac, Imitrex   History (copied from my note on 04/18/2019)  Tanya Mccullough is a 30 y.o. female here today for follow up for migraines. She was started on Emgality and rizatriptan at last visit. She has had complete resolution of her migraines. No headaches in over 2 months. She took rizatriptan once and it worked well. No adverse effects with medications. She is feeling well and without concerns today.   History (copied from Dr Richrd Humbles note on 01/02/2019)  30 year old female here for evaluation of headaches.  Patient has had  headaches since age 24 years old with unilateral throbbing pounding severe headaches. These are associated with nausea, photophobia and phonophobia. Also associated with confusion and slurred speech. These are oftentimes preceded by seeing flashing lights in one eye and unilateral numbness in the face and arm. Headaches can last hours at a time. She was living in [place] previously and tried on Topamax, gabapentin, Imitrex, diclofenac with mild relief. He was having some cognitive side effects with migraine prevention medicines and gradually came off these medicines 5 to 6 months ago.  In last 1 month patient has had increasing, recurrent headaches, with change in severity and features. Now patient having some muscle twitching and daily headaches. Patient has tried Topamax and Imitrex without relief. She went to the emergency room for evaluation. Patient is almost having daily headaches now.   Observations/Objective:  Generalized: Well developed, in no acute distress  Mentation: Alert oriented to time, place, history taking. Follows all commands speech and language fluent   Assessment and Plan:  30 y.o. year old female  has a past medical history of Gestational diabetes, Migraines, and Vaginal Pap smear, abnormal. here with  No diagnosis found.    Tanya Mccullough reports 2 separate events over the last 3 months where she had a persistent migraine for 4 to 5 days.  Event in September required treatment with migraine cocktail.  Most recently migraine resolved with antihistamine and decongestant.  We have discussed several factors that could be related to worsening of migraines.  She feels that migraines could be related to weather changes.  He wishes to continue current therapy with Emgality monthly and rizatriptan as  needed for abortive therapy.  We have discussed the possibility of adding propranolol if migraines continue to be persistent.  May also consider switching rizatriptan to Nurtec if  needed.  She will continue to monitor symptoms closely.  Adequate hydration and healthy well-balanced diet advised.  She will follow-up in 1 year, sooner if needed for any worsening of symptoms.  She verbalizes understanding and agreement with this plan.   No orders of the defined types were placed in this encounter.   No orders of the defined types were placed in this encounter.    Follow Up Instructions:  I discussed the assessment and treatment plan with the patient. The patient was provided an opportunity to ask questions and all were answered. The patient agreed with the plan and demonstrated an understanding of the instructions.   The patient was advised to call back or seek an in-person evaluation if the symptoms worsen or if the condition fails to improve as anticipated.  I provided 15 minutes of non-face-to-face time during this encounter.  Patient is located at her place of residence.  Provider is located in the office.   [name], NP

## 2020-10-22 ENCOUNTER — Telehealth: Payer: Self-pay

## 2020-10-22 ENCOUNTER — Other Ambulatory Visit: Payer: Self-pay | Admitting: Obstetrics and Gynecology

## 2020-10-22 ENCOUNTER — Encounter: Payer: Medicaid Other | Admitting: Family Medicine

## 2020-10-22 ENCOUNTER — Encounter: Payer: Self-pay | Admitting: Family Medicine

## 2020-10-22 ENCOUNTER — Telehealth (INDEPENDENT_AMBULATORY_CARE_PROVIDER_SITE_OTHER): Payer: Medicaid Other | Admitting: Family Medicine

## 2020-10-22 DIAGNOSIS — G43109 Migraine with aura, not intractable, without status migrainosus: Secondary | ICD-10-CM

## 2020-10-22 MED ORDER — RIZATRIPTAN BENZOATE 10 MG PO TBDP: 10.0000 mg | ORAL_TABLET | ORAL | 11 refills | Status: AC | PRN

## 2020-10-22 MED ORDER — NORETHINDRONE 0.35 MG PO TABS: 1.0000 | ORAL_TABLET | Freq: Every day | ORAL | 11 refills | Status: DC

## 2020-10-22 MED ORDER — NARATRIPTAN HCL 2.5 MG PO TABS: 2.5000 mg | ORAL_TABLET | ORAL | 11 refills | Status: AC | PRN

## 2020-10-22 NOTE — Patient Instructions (Addendum)
Below is our plan:  We will try naratriptan for abortive therapy. Please take 1 tablet at onset of headache. May take 1 additional tablet in 2 hours if needed. Do not take more than 2 tablets in 24 hours or more than 10 in a month. May use rizatriptan if needed for faster onset but do not exceed two daily doses of either triptan or 10 total doses each month.   Please make sure you are staying well hydrated. I recommend 50-60 ounces daily. Well balanced diet and regular exercise encouraged. Consistent sleep schedule with 6-8 hours recommended.   Please continue follow up with care team as directed.   Follow up with me in 1 year   You may receive a survey regarding today's visit. I encourage you to leave honest feed back as I do use this information to improve patient care. Thank you for seeing me today!    Migraine with aura: There is increased risk for stroke in women with migraine with aura and a contraindication for the combined contraceptive pill for use by women who have migraine with aura. The risk for women with migraine without aura is lower. However other risk factors like smoking are far more likely to increase stroke risk than migraine. There is a recommendation for no smoking and for the use of OCPs without estrogen such as progestogen only pills particularly for women with migraine with aura.Marland Kitchen People who have migraine headaches with auras may be 3 times more likely to have a stroke caused by a blood clot, compared to migraine patients who don't see auras. Women who take hormone-replacement therapy may be 30 percent more likely to suffer a clot-based stroke than women not taking medication containing estrogen. Other risk factors like smoking and high blood pressure may be  much more important.   Magnesium: may help with headaches are aura, the best evidence for magnesium is for migraine with aura is its thought to stop the cortical spreading depression we believe is the pathophysiology of  migraine aura.  To prevent or relieve headaches, try the following:  Cool Compress. Lie down and place a cool compress on your head.   Avoid headache triggers. If certain foods or odors seem to have triggered your migraines in the past, avoid them. A headache diary might help you identify triggers.   Include physical activity in your daily routine. Try a daily walk or other moderate aerobic exercise.   Manage stress. Find healthy ways to cope with the stressors, such as delegating tasks on your to-do list.   Practice relaxation techniques. Try deep breathing, yoga, massage and visualization.   Eat regularly. Eating regularly scheduled meals and maintaining a healthy diet might help prevent headaches. Also, drink plenty of fluids.   Follow a regular sleep schedule. Sleep deprivation might contribute to headaches  Consider biofeedback. With this mind-body technique, you learn to control certain bodily functions - such as muscle tension, heart rate and blood pressure - to prevent headaches or reduce headache pain.      Proceed to emergency room if you experience new or worsening symptoms or symptoms do not resolve, if you have new neurologic symptoms or if headache is severe, or for any concerning symptom.     Migraine Headache A migraine headache is a very strong throbbing pain on one side or both sides of your head. This type of headache can also cause other symptoms. It can last from 4 hours to 3 days. Talk with your doctor about what  things may bring on (trigger) this condition. What are the causes? The exact cause of this condition is not known. This condition may be triggered or caused by:  Drinking alcohol.  Smoking.  Taking medicines, such as: ? Medicine used to treat chest pain (nitroglycerin). ? Birth control pills. ? Estrogen. ? Some blood pressure medicines.  Eating or drinking certain products.  Doing physical activity. Other things that may trigger a migraine  headache include:  Having a menstrual period.  Pregnancy.  Hunger.  Stress.  Not getting enough sleep or getting too much sleep.  Weather changes.  Tiredness (fatigue). What increases the risk?  Being 74-68 years old.  Being female.  Having a family history of migraine headaches.  Being Caucasian.  Having depression or anxiety.  Being very overweight. What are the signs or symptoms?  A throbbing pain. This pain may: ? Happen in any area of the head, such as on one side or both sides. ? Make it hard to do daily activities. ? Get worse with physical activity. ? Get worse around bright lights or loud noises.  Other symptoms may include: ? Feeling sick to your stomach (nauseous). ? Vomiting. ? Dizziness. ? Being sensitive to bright lights, loud noises, or smells.  Before you get a migraine headache, you may get warning signs (an aura). An aura may include: ? Seeing flashing lights or having blind spots. ? Seeing bright spots, halos, or zigzag lines. ? Having tunnel vision or blurred vision. ? Having numbness or a tingling feeling. ? Having trouble talking. ? Having weak muscles.  Some people have symptoms after a migraine headache (postdromal phase), such as: ? Tiredness. ? Trouble thinking (concentrating). How is this treated?  Taking medicines that: ? Relieve pain. ? Relieve the feeling of being sick to your stomach. ? Prevent migraine headaches.  Treatment may also include: ? Having acupuncture. ? Avoiding foods that bring on migraine headaches. ? Learning ways to control your body functions (biofeedback). ? Therapy to help you know and deal with negative thoughts (cognitive behavioral therapy). Follow these instructions at home: Medicines  Take over-the-counter and prescription medicines only as told by your doctor.  Ask your doctor if the medicine prescribed to you: ? Requires you to avoid driving or using heavy machinery. ? Can cause trouble  pooping (constipation). You may need to take these steps to prevent or treat trouble pooping:  Drink enough fluid to keep your pee (urine) pale yellow.  Take over-the-counter or prescription medicines.  Eat foods that are high in fiber. These include beans, whole grains, and fresh fruits and vegetables.  Limit foods that are high in fat and sugar. These include fried or sweet foods. Lifestyle  Do not drink alcohol.  Do not use any products that contain nicotine or tobacco, such as cigarettes, e-cigarettes, and chewing tobacco. If you need help quitting, ask your doctor.  Get at least 8 hours of sleep every night.  Limit and deal with stress. General instructions  Keep a journal to find out what may bring on your migraine headaches. For example, write down: ? What you eat and drink. ? How much sleep you get. ? Any change in what you eat or drink. ? Any change in your medicines.  If you have a migraine headache: ? Avoid things that make your symptoms worse, such as bright lights. ? It may help to lie down in a dark, quiet room. ? Do not drive or use heavy machinery. ? Ask your doctor  what activities are safe for you.  Keep all follow-up visits as told by your doctor. This is important.      Contact a doctor if:  You get a migraine headache that is different or worse than others you have had.  You have more than 15 headache days in one month. Get help right away if:  Your migraine headache gets very bad.  Your migraine headache lasts longer than 72 hours.  You have a fever.  You have a stiff neck.  You have trouble seeing.  Your muscles feel weak or like you cannot control them.  You start to lose your balance a lot.  You start to have trouble walking.  You pass out (faint).  You have a seizure. Summary  A migraine headache is a very strong throbbing pain on one side or both sides of your head. These headaches can also cause other symptoms.  This  condition may be treated with medicines and changes to your lifestyle.  Keep a journal to find out what may bring on your migraine headaches.  Contact a doctor if you get a migraine headache that is different or worse than others you have had.  Contact your doctor if you have more than 15 headache days in a month. This information is not intended to replace advice given to you by your health care provider. Make sure you discuss any questions you have with your health care provider. Document Revised: 11/25/2018 Document Reviewed: 09/15/2018 Elsevier Patient Education  2021 [employer].

## 2020-10-22 NOTE — Telephone Encounter (Signed)
pt saw Neurologist today they advised that the patch can trigger her Migraines w. Aura and possible;e strokes pt has been on patch for 5-6 wks took off today after visit w/ Neuro pt wld like to get back on mini pill Micronor and wanted to know can she start pills asap pharmacy is [facility] on [address] .   Message was sent to Dr.Constant to advise Rx sent for mini pill   Pt made aware can start today and to expect irregular bleeding. And bleeding may not stop till after 3rd pack  Pt voiced understanding.

## 2020-10-22 NOTE — Progress Notes (Signed)
PATIENT: Tanya Mccullough DOB: [birth year]  REASON FOR VISIT: follow up HISTORY FROM: patient  Virtual Visit via Telephone Note  I connected with Tanya Mccullough on 10/22/20 at 10:30 AM EST by telephone and verified that I am speaking with the correct person using two identifiers.   I discussed the limitations, risks, security and privacy concerns of performing an evaluation and management service by telephone and the availability of in person appointments. I also discussed with the patient that there may be a patient responsible charge related to this service. The patient expressed understanding and agreed to proceed.   History of Present Illness:  10/22/20 ALL:  She returns for migraine follow up. She delivered a heathy baby boy 6 months ago. Headaches were fairly well managed until the past 2-3 months. She restarted oral contraceptives and had worsening or headaches. She has recently switched to [facility] patch and feels it is working better for her. Migraines have settled down, somewhat. She continues to have about 4 migraines per month. Typically unilateral, throbbing pain. She has light and sound sensitivity and tingling of her face. She does described zig zag lines in visual field prior to headache. Does not typically have any other headaches. Rizatriptan will help but does not always abort headaches. She feels they are worse around menstrual cycle.   06/29/2019 ALL:  Tanya Mccullough is a 30 y.o. female here today for follow up for migraines. She was seen last on 9/1 and doing very well on Emgality. She had a migraine on 9/25 that was unresponsive to rizatriptan. She was given a migraine cocktail of Toradol, Reglan and Decadron. Migraine nearly resolved with treatment. About a month later, she had another migraine that was persistent for 4-5 days. She found relief with benadryl and Sudafed. She has not had any headaches since. Rizatriptan usually helps to abort  migraines. She takes naproxen as needed. BP running 120/80's. No new or concerning symptoms.   Medications tried: Emgality, topiramate, gabapentin, diclofenac, Imitrex   History (copied from my note on 04/18/2019)  Tanya Mccullough is a 30 y.o. female here today for follow up for migraines. She was started on Emgality and rizatriptan at last visit. She has had complete resolution of her migraines. No headaches in over 2 months. She took rizatriptan once and it worked well. No adverse effects with medications. She is feeling well and without concerns today.   History (copied from Dr Richrd Humbles note on 01/02/2019)  30 year old female here for evaluation of headaches.  Patient has had headaches since age 30 years old with unilateral throbbing pounding severe headaches. These are associated with nausea, photophobia and phonophobia. Also associated with confusion and slurred speech. These are oftentimes preceded by seeing flashing lights in one eye and unilateral numbness in the face and arm. Headaches can last hours at a time. She was living in [place] previously and tried on Topamax, gabapentin, Imitrex, diclofenac with mild relief. He was having some cognitive side effects with migraine prevention medicines and gradually came off these medicines 5 to 6 months ago.  In last 1 month patient has had increasing, recurrent headaches, with change in severity and features. Now patient having some muscle twitching and daily headaches. Patient has tried Topamax and Imitrex without relief. She went to the emergency room for evaluation. Patient is almost having daily headaches now.   Observations/Objective:  Generalized: Well developed, in no acute distress  Mentation: Alert oriented to time, place, history taking. Follows all commands speech and language  fluent   Assessment and Plan:  30 y.o. year old female  has a past medical history of Gestational diabetes, Migraines, and Vaginal Pap  smear, abnormal. here with    ICD-10-CM   1. Migraine with aura and without status migrainosus, not intractable  G43.109       Tanya Mccullough reports worsening migraines since giving birth and restarting birth control. She is doing better on Xulane patch but continues to have 4 migraines per month. We will start naratriptan to help with menstrual migraines. Appropriate dose and administration reviewed. Educational material provided for review as well. She may use rizatriptan as needed for quick onset but advised against regular use of either. No more than 2 total doses per 24 hour period or 10 per month. Risk of taking combination birth control in setting of migraine with aura discussed. She will discuss with GYN. She will continue to monitor symptoms closely.  Adequate hydration and healthy well-balanced diet advised.  She will follow-up in 1 year, sooner if needed for any worsening of symptoms.  She verbalizes understanding and agreement with this plan.   No orders of the defined types were placed in this encounter.   Meds ordered this encounter  Medications   naratriptan (AMERGE) 2.5 MG tablet    Sig: Take 1 tablet (2.5 mg total) by mouth as needed for migraine. Take one (1) tablet at onset of headache; if returns or does not resolve, may repeat after 2 hours; do not exceed 2 doses in 24 hours or 10 per month.    Dispense:  10 tablet    Refill:  11    Order Specific Question:   Supervising Provider    Answer:   Anson Fret [[id number]]   rizatriptan (MAXALT-MLT) 10 MG disintegrating tablet    Sig: Take 1 tablet (10 mg total) by mouth as needed for migraine. May repeat in 2 hours if needed    Dispense:  10 tablet    Refill:  11    Order Specific Question:   Supervising Provider    Answer:   Anson Fret [[id number]]     Follow Up Instructions:  I discussed the assessment and treatment plan with the patient. The patient was provided an opportunity to ask questions and all were  answered. The patient agreed with the plan and demonstrated an understanding of the instructions.   The patient was advised to call back or seek an in-person evaluation if the symptoms worsen or if the condition fails to improve as anticipated.  I provided 15 minutes of non-face-to-face time during this encounter.  Patient is located at her place of residence.  Provider is located in the office.   [name], NP

## 2020-10-22 NOTE — Progress Notes (Signed)
I reviewed note and agree with plan.   Suanne Marker, MD 10/22/2020, 4:21 PM Certified in Neurology, Neurophysiology and Neuroimaging  [facility] [address] [place], [place] [address] [phone]

## 2020-11-14 ENCOUNTER — Telehealth: Payer: Self-pay | Admitting: Internal Medicine

## 2020-11-14 NOTE — Telephone Encounter (Signed)
Attempted to reach Tanya Mccullough today to get her rescheduled with the [facility] on the Managed Medicaid Team. She did not answer and there was not a voicemail. I will reach out again in the next 7-14 days.

## 2020-11-20 ENCOUNTER — Telehealth: Payer: Self-pay | Admitting: Internal Medicine

## 2020-11-20 NOTE — Telephone Encounter (Signed)
2nd attempt to reach patient to reschedule her for a phone visit with the Managed Medicaid team. No one answered and there was not a VM to leave a message. Will reach out again in the next 7-14 days.

## 2020-12-09 DIAGNOSIS — N814 Uterovaginal prolapse, unspecified: Secondary | ICD-10-CM | POA: Insufficient documentation

## 2021-06-16 ENCOUNTER — Telehealth: Payer: Self-pay | Admitting: *Deleted

## 2021-06-16 NOTE — Telephone Encounter (Signed)
Naratriptan has been approved for coverage by [employer]. The drug is approved for coverage until 06/16/2022.

## 2021-06-16 NOTE — Telephone Encounter (Signed)
Submitted PA naratriptan on CMM. [employer] - PA Case ID: [id number] - Rx #: [id number].  Waiting on determination from [employer].

## 2021-10-26 ENCOUNTER — Telehealth: Payer: Medicaid Other | Admitting: Family

## 2021-10-26 DIAGNOSIS — N760 Acute vaginitis: Secondary | ICD-10-CM | POA: Diagnosis not present

## 2021-10-26 DIAGNOSIS — B9689 Other specified bacterial agents as the cause of diseases classified elsewhere: Secondary | ICD-10-CM

## 2021-10-26 DIAGNOSIS — N898 Other specified noninflammatory disorders of vagina: Secondary | ICD-10-CM

## 2021-10-26 MED ORDER — METRONIDAZOLE 500 MG PO TABS: 500.0000 mg | ORAL_TABLET | Freq: Two times a day (BID) | ORAL | 0 refills | Status: DC

## 2021-10-26 NOTE — Progress Notes (Signed)
?Virtual Visit Consent  ? ?[name], you are scheduled for a virtual visit with a [facility] provider today.   ?  ?Just as with appointments in the office, your consent must be obtained to participate.  Your consent will be active for this visit and any virtual visit you may have with one of our providers in the next 365 days.   ?  ?If you have a MyChart account, a copy of this consent can be sent to you electronically.  All virtual visits are billed to your insurance company just like a traditional visit in the office.   ? ?As this is a virtual visit, video technology does not allow for your provider to perform a traditional examination.  This may limit your provider's ability to fully assess your condition.  If your provider identifies any concerns that need to be evaluated in person or the need to arrange testing (such as labs, EKG, etc.), we will make arrangements to do so.   ?  ?Although advances in technology are sophisticated, we cannot ensure that it will always work on either your end or our end.  If the connection with a video visit is poor, the visit may have to be switched to a telephone visit.  With either a video or telephone visit, we are not always able to ensure that we have a secure connection.    ? ?I need to obtain your verbal consent now.   Are you willing to proceed with your visit today?  ?  ?Tanya Mccullough has provided verbal consent on 10/26/2021 for a virtual visit (video or telephone). ?  ?Jannifer Rodney, FNP  ? ?Date: 10/26/2021 12:05 PM ? ? ?Virtual Visit via Video Note  ? ?IJannifer Rodney, connected with  Tanya Mccullough  ([id number], 1991/01/18) on 10/26/21 at 12:00 PM EDT by a video-enabled telemedicine application and verified that I am speaking with the correct person using two identifiers. ? ?Location: ?Patient: Virtual Visit Location Patient: Home ?Provider: Virtual Visit Location Provider: Home Office ?  ?I discussed the limitations of evaluation  and management by telemedicine and the availability of in person appointments. The patient expressed understanding and agreed to proceed.   ? ?History of Present Illness: ?Tanya Mccullough is a 31 y.o. who identifies as a female who was assigned female at birth, and is being seen today for vaginal discharge. ? ?HPI: Vaginal Discharge ?The patient's primary symptoms include a genital odor and vaginal discharge. The patient's pertinent negatives include no genital itching or vaginal bleeding. This is a new problem. The current episode started in the past 7 days. The problem occurs constantly. The problem has been gradually worsening. Associated symptoms include urgency. Pertinent negatives include no painful intercourse. The vaginal discharge was yellow. She has tried nothing for the symptoms. The treatment provided no relief.   ?Problems:  ?Patient Active Problem List  ? Diagnosis Date Noted  ? Vaginal delivery 04/18/2020  ? Insulin controlled gestational diabetes mellitus (GDM) in third trimester 04/16/2020  ? Indication for care or intervention in labor or delivery 04/16/2020  ? Hyperglycemia during pregnancy 03/30/2020  ? Hyperglycemia in pregnancy 03/26/2020  ? Preterm uterine contractions in third trimester, antepartum 03/26/2020  ? Supervision of other normal pregnancy, antepartum 10/13/2019  ?  ?Allergies:  ?Allergies  ?Allergen Reactions  ? Prochlorperazine Other (See Comments)  ?  Lip twitching  ? ?Medications:  ?Current Outpatient Medications:  ?  metroNIDAZOLE (FLAGYL) 500 MG tablet, Take 1 tablet (500  mg total) by mouth 2 (two) times daily., Disp: 14 tablet, Rfl: 0 ?  naratriptan (AMERGE) 2.5 MG tablet, Take 1 tablet (2.5 mg total) by mouth as needed for migraine. Take one (1) tablet at onset of headache; if returns or does not resolve, may repeat after 2 hours; do not exceed 2 doses in 24 hours or 10 per month., Disp: 10 tablet, Rfl: 11 ?  norethindrone (MICRONOR) 0.35 MG tablet, Take 1 tablet  (0.35 mg total) by mouth daily., Disp: 28 tablet, Rfl: 11 ?  rizatriptan (MAXALT-MLT) 10 MG disintegrating tablet, Take 1 tablet (10 mg total) by mouth as needed for migraine. May repeat in 2 hours if needed, Disp: 10 tablet, Rfl: 11 ?  XULANE 150-35 MCG/24HR transdermal patch, SMARTSIG:Topical, Disp: , Rfl:  ? ?Observations/Objective: ?Patient is well-developed, well-nourished in no acute distress.  ?Resting comfortably  at home.  ?Head is normocephalic, atraumatic.  ?No labored breathing.  ?Speech is clear and coherent with logical content.  ?Patient is alert and oriented at baseline.  ?2 ? ?Assessment and Plan: ?1. BV (bacterial vaginosis) ?- metroNIDAZOLE (FLAGYL) 500 MG tablet; Take 1 tablet (500 mg total) by mouth 2 (two) times daily.  Dispense: 14 tablet; Refill: 0 ? ?Safe sex ?Probiotic ?If symptoms continue, need to be seen in person for more testing.  ? ?Follow Up Instructions: ?I discussed the assessment and treatment plan with the patient. The patient was provided an opportunity to ask questions and all were answered. The patient agreed with the plan and demonstrated an understanding of the instructions.  A copy of instructions were sent to the patient via MyChart unless otherwise noted below.  ? ? ? ?The patient was advised to call back or seek an in-person evaluation if the symptoms worsen or if the condition fails to improve as anticipated. ? ?Time:  ?I spent  14 minutes with the patient via telehealth technology discussing the above problems/concerns.   ? ?Jannifer Rodney, FNP ? ?

## 2021-10-26 NOTE — Progress Notes (Signed)
PT completed video visit. Will no charge this visit.  ? ?Jannifer Rodney, FNP ? ?

## 2021-10-29 ENCOUNTER — Other Ambulatory Visit: Payer: Self-pay | Admitting: Family

## 2021-11-25 ENCOUNTER — Ambulatory Visit: Payer: Medicaid Other | Admitting: Family Medicine

## 2021-11-25 ENCOUNTER — Encounter: Payer: Self-pay | Admitting: Family Medicine

## 2021-12-25 ENCOUNTER — Telehealth: Payer: Medicaid Other | Admitting: Physician Assistant

## 2021-12-25 DIAGNOSIS — N760 Acute vaginitis: Secondary | ICD-10-CM

## 2021-12-25 DIAGNOSIS — B9689 Other specified bacterial agents as the cause of diseases classified elsewhere: Secondary | ICD-10-CM | POA: Diagnosis not present

## 2021-12-25 MED ORDER — METRONIDAZOLE 0.75 % EX GEL: CUTANEOUS | 0 refills | Status: DC

## 2021-12-25 NOTE — Progress Notes (Signed)
E-Visit for Vaginal Symptoms ? ?We are sorry that you are not feeling well. Here is how we plan to help! ?Based on what you shared with me it looks like you: May have a vaginosis due to bacteria ? ?Vaginosis is an inflammation of the vagina that can result in discharge, itching and pain. The cause is usually a change in the normal balance of vaginal bacteria or an infection. Vaginosis can also result from reduced estrogen levels after menopause. ? ?The most common causes of vaginosis are: ? ? Bacterial vaginosis which results from an overgrowth of one on several organisms that are normally present in your vagina. ? ? Yeast infections which are caused by a naturally occurring fungus called candida. ? ? Vaginal atrophy (atrophic vaginosis) which results from the thinning of the vagina from reduced estrogen levels after menopause. ? ? Trichomoniasis which is caused by a parasite and is commonly transmitted by sexual intercourse. ? ?Factors that increase your risk of developing vaginosis include: ?Medications, such as antibiotics and steroids ?Uncontrolled diabetes ?Use of hygiene products such as bubble bath, vaginal spray or vaginal deodorant ?Douching ?Wearing damp or tight-fitting clothing ?Using an intrauterine device (IUD) for birth control ?Hormonal changes, such as those associated with pregnancy, birth control pills or menopause ?Sexual activity ?Having a sexually transmitted infection ? ?Your treatment plan is Metrogel, applied as directed for 5 nights.  ? ?Be sure to take all of the medication as directed. Stop taking any medication if you develop a rash, tongue swelling or shortness of breath. Mothers who are breast feeding should consider pumping and discarding their breast milk while on these antibiotics. However, there is no consensus that infant exposure at these doses would be harmful.  ?Remember that medication creams can weaken latex condoms. ?. ? ? ?HOME CARE: ? ?Good hygiene may prevent some types  of vaginosis from recurring and may relieve some symptoms: ? ?Avoid baths, hot tubs and whirlpool spas. Rinse soap from your outer genital area after a shower, and dry the area well to prevent irritation. Don't use scented or harsh soaps, such as those with deodorant or antibacterial action. ?Avoid irritants. These include scented tampons and pads. ?Wipe from front to back after using the toilet. Doing so avoids spreading fecal bacteria to your vagina. ? ?Other things that may help prevent vaginosis include: ? ?Don't douche. Your vagina doesn't require cleansing other than normal bathing. Repetitive douching disrupts the normal organisms that reside in the vagina and can actually increase your risk of vaginal infection. Douching won't clear up a vaginal infection. ?Use a latex condom. Both female and female latex condoms may help you avoid infections spread by sexual contact. ?Wear cotton underwear. Also wear pantyhose with a cotton crotch. If you feel comfortable without it, skip wearing underwear to bed. Yeast thrives in moist environments ?Your symptoms should improve in the next day or two. ? ?GET HELP RIGHT AWAY IF: ? ?You have pain in your lower abdomen ( pelvic area or over your ovaries) ?You develop nausea or vomiting ?You develop a fever ?Your discharge changes or worsens ?You have persistent pain with intercourse ?You develop shortness of breath, a rapid pulse, or you faint. ? ?These symptoms could be signs of problems or infections that need to be evaluated by a medical provider now. ? ?MAKE SURE YOU  ? ?Understand these instructions. ?Will watch your condition. ?Will get help right away if you are not doing well or get worse. ? ?Thank you for choosing an  e-visit. ? ?Your e-visit answers were reviewed by a board certified advanced clinical practitioner to complete your personal care plan. Depending upon the condition, your plan could have included both over the counter or prescription medications. ? ?Please  review your pharmacy choice. Make sure the pharmacy is open so you can pick up prescription now. If there is a problem, you may contact your provider through [employer] and have the prescription routed to another pharmacy.  Your safety is important to [place]. If you have drug allergies check your prescription carefully.  ? ?For the next 24 hours you can use MyChart to ask questions about today's visit, request a non-urgent call back, or ask for a work or school excuse. ?You will get an email in the next two days asking about your experience. I hope that your e-visit has been valuable and will speed your recovery. ? ?

## 2021-12-25 NOTE — Progress Notes (Signed)
I have spent 5 minutes in review of e-visit questionnaire, review and updating patient chart, medical decision making and response to patient.   Virlan Kempker Cody Kerin Cecchi, PA-C    

## 2021-12-29 ENCOUNTER — Encounter: Payer: Self-pay | Admitting: Obstetrics and Gynecology

## 2021-12-29 ENCOUNTER — Other Ambulatory Visit (HOSPITAL_COMMUNITY)
Admission: RE | Admit: 2021-12-29 | Discharge: 2021-12-29 | Disposition: A | Discharge status: Home / Self Care | Payer: Medicaid Other | Source: Ambulatory Visit | Attending: Obstetrics and Gynecology | Admitting: Obstetrics and Gynecology

## 2021-12-29 ENCOUNTER — Ambulatory Visit (INDEPENDENT_AMBULATORY_CARE_PROVIDER_SITE_OTHER): Payer: Medicaid Other | Admitting: Obstetrics and Gynecology

## 2021-12-29 VITALS — BP 113/77 | HR 70 | Ht 67.0 in | Wt 188.0 lb

## 2021-12-29 DIAGNOSIS — Z01419 Encounter for gynecological examination (general) (routine) without abnormal findings: Secondary | ICD-10-CM

## 2021-12-29 MED ORDER — NORETHINDRONE 0.35 MG PO TABS: 1.0000 | ORAL_TABLET | Freq: Every day | ORAL | 11 refills | Status: AC

## 2021-12-29 MED ORDER — METRONIDAZOLE 0.75 % EX GEL: CUTANEOUS | 0 refills | Status: DC

## 2021-12-29 NOTE — Addendum Note (Signed)
Addended by: Margaretann Loveless on: 12/29/2021 01:04 PM ? ? Modules accepted: Orders ? ?

## 2021-12-29 NOTE — Progress Notes (Signed)
Subjective:  ?  ? Tanya Mccullough is a 31 y.o. female P3 with LMP 12/22/21 and BMI 29 who is here for a comprehensive physical exam. The patient reports the presence of a non pruritic vaginal discharge with odor. She was prescribed flagyl during a virtual visit and is requesting to be tested for STI and confirm BV diagnosis. She reports a monthly periods lasting 3 days. She is sexually active using POP for contraception. She denies pelvic pain or abnormal discharge. She denies urinary incontinence ? ?Past Medical History:  ?Diagnosis Date  ? Gestational diabetes   ? Normal postprandial 2 hr GTT  ? Migraines   ? Vaginal Pap smear, abnormal   ? ?Past Surgical History:  ?Procedure Laterality Date  ? CHOLECYSTECTOMY    ? GALLBLADDER SURGERY    ? TONSILECTOMY, ADENOIDECTOMY, BILATERAL MYRINGOTOMY AND TUBES    ? ?Family History  ?Problem Relation Age of Onset  ? Healthy Mother   ? Other Father   ?     murdered  ? Diabetes Maternal Grandmother   ? Hypertension Maternal Grandmother   ? Migraines Cousin   ? ADD / ADHD Son   ? Asthma Son   ? Cancer Maternal Aunt   ? Hypertension Maternal Aunt   ? ? ?Social History  ? ?Socioeconomic History  ? Marital status: Single  ?  Spouse name: Not on file  ? Number of children: 2  ? Years of education: Not on file  ? Highest education level: Bachelor's degree (e.g., BA, AB, BS)  ?Occupational History  ?  Comment: medical admin  ?Tobacco Use  ? Smoking status: Never  ? Smokeless tobacco: Never  ?Vaping Use  ? Vaping Use: Never used  ?Substance and Sexual Activity  ? Alcohol use: Not Currently  ?  Comment: 1 glass every days  ? Drug use: Never  ? Sexual activity: Yes  ?Other Topics Concern  ? Not on file  ?Social History Narrative  ? Lives with 2 sons  ? Caffeine- rare soda  ? ?Social Determinants of Health  ? ?Financial Resource Strain: Not on file  ?Food Insecurity: Not on file  ?Transportation Needs: Not on file  ?Physical Activity: Not on file  ?Stress: Not on file  ?Social  Connections: Not on file  ?Intimate Partner Violence: Not on file  ? ?Health Maintenance  ?Topic Date Due  ? COVID-19 Vaccine (1) Never done  ? URINE MICROALBUMIN  Never done  ? INFLUENZA VACCINE  03/17/2022  ? PAP SMEAR-Modifier  10/20/2022  ? TETANUS/TDAP  02/06/2030  ? Hepatitis C Screening  Completed  ? HIV Screening  Completed  ? HPV VACCINES  Aged Out  ? ? ?  ? ?Review of Systems ?Pertinent items noted in HPI and remainder of comprehensive ROS otherwise negative.  ? ?Objective:  ?Blood pressure 113/77, pulse 70, height 5\' 7"  (1.702 m), weight 188 lb (85.3 kg), last menstrual period 12/22/2021, not currently breastfeeding. ? ? GENERAL: Well-developed, well-nourished female in no acute distress.  ?HEENT: Normocephalic, atraumatic. Sclerae anicteric.  ?NECK: Supple. Normal thyroid.  ?LUNGS: Clear to auscultation bilaterally.  ?HEART: Regular rate and rhythm. ?BREASTS: Symmetric in size. No palpable masses or lymphadenopathy, skin changes, or nipple drainage. ?ABDOMEN: Soft, nontender, nondistended. No organomegaly. ?PELVIC: Normal external female genitalia. Vagina is pink and rugated.  Normal discharge. Normal appearing cervix. Uterus is normal in size. No adnexal mass or tenderness. Chaperone present during the pelvic exam ?EXTREMITIES: No cyanosis, clubbing, or edema, 2+ distal pulses. ?  ?  ?  Assessment:  ? ? Healthy female exam.    ?  ?Plan:  ? ? Pap smear collected ?Vaginal swab ordered ?Refill on POP provided ?Patient will be contacted with abnormal results ?See After Visit Summary for Counseling Recommendations  ? ?

## 2021-12-29 NOTE — Progress Notes (Signed)
Pt needs refill on BC. ?Pt recently had Evisit for BV. Pt did not have swab, would like full swab today.  ? ?PHQ score 7, pt does see therapist.  ? ? ? ?

## 2021-12-30 ENCOUNTER — Other Ambulatory Visit: Payer: Self-pay | Admitting: *Deleted

## 2021-12-30 LAB — CERVICOVAGINAL ANCILLARY ONLY
Bacterial Vaginitis (gardnerella): POSITIVE — AB
Candida Glabrata: NEGATIVE
Candida Vaginitis: NEGATIVE
Chlamydia: NEGATIVE
Comment: NEGATIVE
Comment: NEGATIVE
Comment: NEGATIVE
Comment: NEGATIVE
Comment: NEGATIVE
Comment: NORMAL
Neisseria Gonorrhea: NEGATIVE
Trichomonas: NEGATIVE

## 2021-12-30 NOTE — Progress Notes (Signed)
Returned TC to pt regarding Micronor not covered by insurance. Pt has now talked back with pharmacy and they have since run med under the correct policy and it is covered. ?

## 2021-12-31 ENCOUNTER — Encounter: Payer: Self-pay | Admitting: Obstetrics and Gynecology

## 2021-12-31 LAB — CYTOLOGY - PAP
Comment: NEGATIVE
Comment: NEGATIVE
Comment: NEGATIVE
HPV 16: NEGATIVE
HPV 18 / 45: POSITIVE — AB
High risk HPV: POSITIVE — AB

## 2022-01-06 ENCOUNTER — Encounter: Payer: Self-pay | Admitting: Obstetrics and Gynecology

## 2022-01-07 NOTE — Telephone Encounter (Signed)
Call placed to pt making her aware that her Mcd is showing she still has primary ins coverage on her profile.  Her Rx's cannot be covered and completed until corrected. Pt states that she has followed up with case worker and has gotten insurance taken care of. Pt advised to call pharmacy and make them aware and verify coverage.

## 2022-04-07 ENCOUNTER — Telehealth: Payer: Medicaid Other | Admitting: Physician Assistant

## 2022-04-07 DIAGNOSIS — B3731 Acute candidiasis of vulva and vagina: Secondary | ICD-10-CM | POA: Diagnosis not present

## 2022-04-07 MED ORDER — FLUCONAZOLE 150 MG PO TABS: 150.0000 mg | ORAL_TABLET | Freq: Once | ORAL | 0 refills | Status: AC

## 2022-04-07 NOTE — Progress Notes (Signed)

## 2022-04-07 NOTE — Progress Notes (Signed)
I have spent 5 minutes in review of e-visit questionnaire, review and updating patient chart, medical decision making and response to patient.   Kenitra Leventhal Cody Jed Kutch, PA-C    

## 2022-04-30 DIAGNOSIS — Y999 Unspecified external cause status: Secondary | ICD-10-CM | POA: Diagnosis not present

## 2022-04-30 DIAGNOSIS — M545 Low back pain, unspecified: Secondary | ICD-10-CM | POA: Diagnosis not present

## 2022-04-30 DIAGNOSIS — M79671 Pain in right foot: Secondary | ICD-10-CM | POA: Diagnosis not present

## 2022-05-02 DIAGNOSIS — J029 Acute pharyngitis, unspecified: Secondary | ICD-10-CM | POA: Diagnosis not present

## 2022-05-02 DIAGNOSIS — R5383 Other fatigue: Secondary | ICD-10-CM | POA: Diagnosis not present

## 2022-05-02 DIAGNOSIS — Z20822 Contact with and (suspected) exposure to covid-19: Secondary | ICD-10-CM | POA: Diagnosis not present

## 2022-05-05 ENCOUNTER — Other Ambulatory Visit (HOSPITAL_COMMUNITY)
Admission: RE | Admit: 2022-05-05 | Discharge: 2022-05-05 | Disposition: A | Discharge status: Home / Self Care | Payer: Medicaid Other | Source: Ambulatory Visit | Attending: Obstetrics & Gynecology | Admitting: Obstetrics & Gynecology

## 2022-05-05 ENCOUNTER — Ambulatory Visit (INDEPENDENT_AMBULATORY_CARE_PROVIDER_SITE_OTHER): Payer: Medicaid Other | Admitting: Family Medicine

## 2022-05-05 VITALS — BP 133/83 | HR 80 | Wt 198.0 lb

## 2022-05-05 DIAGNOSIS — R87612 Low grade squamous intraepithelial lesion on cytologic smear of cervix (LGSIL): Secondary | ICD-10-CM | POA: Diagnosis not present

## 2022-05-05 DIAGNOSIS — G43509 Persistent migraine aura without cerebral infarction, not intractable, without status migrainosus: Secondary | ICD-10-CM | POA: Insufficient documentation

## 2022-05-05 DIAGNOSIS — Z23 Encounter for immunization: Secondary | ICD-10-CM

## 2022-05-05 NOTE — Progress Notes (Signed)
    GYNECOLOGY OFFICE COLPOSCOPY PROCEDURE NOTE  31 y.o. [id number] here for colposcopy for low-grade squamous intraepithelial neoplasia (LGSIL - encompassing HPV,mild dysplasia,CIN I) pap smear on 12/29/2021. Discussed role for HPV in cervical dysplasia, need for surveillance.  Patient gave informed written consent, time out was performed.  Placed in lithotomy position. Cervix viewed with speculum and colposcope after application of acetic acid.   Colposcopy adequate? Yes  acetowhite lesion(s) noted at entire SCJ 4 quadrant biopsies obtained.  ECC specimen obtained. All specimens were labeled and sent to pathology.  Chaperone was present during entire procedure.  Patient was given post procedure instructions.  Will follow up pathology and manage accordingly; patient will be contacted with results and recommendations.  Routine preventative health maintenance measures emphasized. Gardasil #3 given.  Offered and reports she has already had this year's flu shot.   Donnamae Jude, MD  05/05/2022 9:00 AM

## 2022-05-06 LAB — SURGICAL PATHOLOGY

## 2022-05-08 ENCOUNTER — Encounter: Payer: Self-pay | Admitting: Family Medicine

## 2022-05-12 ENCOUNTER — Ambulatory Visit: Payer: Medicaid Other

## 2022-05-12 DIAGNOSIS — Z0271 Encounter for disability determination: Secondary | ICD-10-CM

## 2022-05-18 ENCOUNTER — Ambulatory Visit: Payer: Medicaid Other

## 2022-05-18 NOTE — Progress Notes (Deleted)
SUBJECTIVE:  31 y.o. female complains of {pe vag discharge desc:315065} vaginal discharge for *** {gen duration:315003}. Denies abnormal vaginal bleeding or significant pelvic pain or fever. No UTI symptoms. Denies history of known exposure to STD.  Patient's last menstrual period was 03/02/2022 (approximate).  OBJECTIVE:  She appears well, afebrile. Urine dipstick: {ua dip:315113}.  ASSESSMENT:  Vaginal Discharge  Vaginal Odor   PLAN:  GC, chlamydia, trichomonas, BVAG, CVAG probe sent to lab. Treatment: To be determined once lab results are received ROV prn if symptoms persist or worsen.

## 2022-07-14 ENCOUNTER — Encounter: Payer: Medicaid Other | Admitting: Obstetrics and Gynecology

## 2022-07-21 ENCOUNTER — Other Ambulatory Visit: Payer: Self-pay | Admitting: Family Medicine

## 2022-07-21 DIAGNOSIS — Z1231 Encounter for screening mammogram for malignant neoplasm of breast: Secondary | ICD-10-CM

## 2022-07-22 ENCOUNTER — Ambulatory Visit: Payer: Medicaid Other

## 2022-07-22 DIAGNOSIS — Z1231 Encounter for screening mammogram for malignant neoplasm of breast: Secondary | ICD-10-CM

## 2022-08-13 ENCOUNTER — Telehealth: Payer: Medicaid Other | Admitting: Emergency Medicine

## 2022-08-13 DIAGNOSIS — R6889 Other general symptoms and signs: Secondary | ICD-10-CM | POA: Diagnosis not present

## 2022-08-13 MED ORDER — OSELTAMIVIR PHOSPHATE 75 MG PO CAPS: 75.0000 mg | ORAL_CAPSULE | Freq: Two times a day (BID) | ORAL | 0 refills | Status: AC

## 2022-08-13 NOTE — Progress Notes (Signed)
E visit for Flu like symptoms   We are sorry that you are not feeling well.  Here is how we plan to help! Based on what you have shared with me it looks like you may have a respiratory virus that may be influenza.  Influenza or "the flu" is   an infection caused by a respiratory virus. The flu virus is highly contagious and persons who did not receive their yearly flu vaccination may "catch" the flu from close contact.  We have anti-viral medications to treat the viruses that cause this infection. They are not a "cure" and only shorten the course of the infection. These prescriptions are most effective when they are given within the first 2 days of "flu" symptoms. Antiviral medication are indicated if you have a high risk of complications from the flu. You should  also consider an antiviral medication if you are in close contact with someone who is at risk. These medications can help patients avoid complications from the flu  but have side effects that you should know. Possible side effects from Tamiflu or oseltamivir include nausea, vomiting, diarrhea, dizziness, headaches, eye redness, sleep problems or other respiratory symptoms. You should not take Tamiflu if you have an allergy to oseltamivir or any to the ingredients in Tamiflu.  Based upon your symptoms and potential risk factors I have prescribed Oseltamivir (Tamiflu).  It has been sent to your designated pharmacy.  You will take one 75 mg capsule orally twice a day for the next 5 days.  ANYONE WHO HAS FLU SYMPTOMS SHOULD: Stay home. The flu is highly contagious and going out or to work exposes others! Be sure to drink plenty of fluids. Water is fine as well as fruit juices, sodas and electrolyte beverages. You may want to stay away from caffeine or alcohol. If you are nauseated, try taking small sips of liquids. How do you know if you are getting enough fluid? Your urine should be a pale yellow or almost colorless. Get rest. Taking a steamy  shower or using a humidifier may help nasal congestion and ease sore throat pain. Using a saline nasal spray works much the same way. Cough drops, hard candies and sore throat lozenges may ease your cough. Line up a caregiver. Have someone check on you regularly.   GET HELP RIGHT AWAY IF: You cannot keep down liquids or your medications. You become short of breath Your fell like you are going to pass out or loose consciousness. Your symptoms persist after you have completed your treatment plan MAKE SURE YOU  Understand these instructions. Will watch your condition. Will get help right away if you are not doing well or get worse.  Your e-visit answers were reviewed by a board certified advanced clinical practitioner to complete your personal care plan.  Depending on the condition, your plan could have included both over the counter or prescription medications.  If there is a problem please reply  once you have received a response from your provider.  Your safety is important to us.  If you have drug allergies check your prescription carefully.    You can use MyChart to ask questions about today's visit, request a non-urgent call back, or ask for a work or school excuse for 24 hours related to this e-Visit. If it has been greater than 24 hours you will need to follow up with your provider, or enter a new e-Visit to address those concerns.  You will get an e-mail in the next   two days asking about your experience.  I hope that your e-visit has been valuable and will speed your recovery. Thank you for using e-visits.   Approximately 5 minutes was used in reviewing the patient's chart, questionnaire, prescribing medications, and documentation.  

## 2022-08-21 ENCOUNTER — Encounter: Payer: Medicaid Other | Admitting: Obstetrics and Gynecology

## 2022-09-03 DIAGNOSIS — B370 Candidal stomatitis: Secondary | ICD-10-CM | POA: Diagnosis not present

## 2022-09-03 DIAGNOSIS — H66003 Acute suppurative otitis media without spontaneous rupture of ear drum, bilateral: Secondary | ICD-10-CM | POA: Diagnosis not present

## 2022-11-01 DIAGNOSIS — Z758 Other problems related to medical facilities and other health care: Secondary | ICD-10-CM | POA: Diagnosis not present

## 2022-11-01 DIAGNOSIS — R079 Chest pain, unspecified: Secondary | ICD-10-CM | POA: Diagnosis not present

## 2022-11-06 DIAGNOSIS — H5213 Myopia, bilateral: Secondary | ICD-10-CM | POA: Diagnosis not present

## 2022-12-02 DIAGNOSIS — D069 Carcinoma in situ of cervix, unspecified: Secondary | ICD-10-CM | POA: Diagnosis not present

## 2022-12-04 ENCOUNTER — Telehealth: Payer: Medicaid Other | Admitting: Family Medicine

## 2022-12-04 DIAGNOSIS — J029 Acute pharyngitis, unspecified: Secondary | ICD-10-CM | POA: Diagnosis not present

## 2022-12-04 MED ORDER — AMOXICILLIN 500 MG PO TABS: 500.0000 mg | ORAL_TABLET | Freq: Two times a day (BID) | ORAL | 0 refills | Status: AC

## 2022-12-04 MED ORDER — IBUPROFEN 600 MG PO TABS
600.0000 mg | ORAL_TABLET | Freq: Three times a day (TID) | ORAL | 0 refills | Status: AC | PRN
Start: 2022-12-04 — End: ?

## 2022-12-04 NOTE — Progress Notes (Signed)
E-Visit for Sore Throat - Strep Symptoms  We are sorry that you are not feeling well.  Here is how we plan to help!  Based on what you have shared with me it is likely that you have strep pharyngitis.  Strep pharyngitis is inflammation and infection in the back of the throat.  This is an infection cause by bacteria and is treated with antibiotics.  I have prescribed Amoxicillin 500 mg twice a day for 10 days and Ibuprofen 600 mg #28 four times a day for 7 days if needed for pain and or fever control. For throat pain, we recommend over the counter oral pain relief medications such as acetaminophen or aspirin, or anti-inflammatory medications such as ibuprofen or naproxen sodium. Topical treatments such as oral throat lozenges or sprays may be used as needed. Strep infections are not as easily transmitted as other respiratory infections, however we still recommend that you avoid close contact with loved ones, especially the very young and elderly.  Remember to wash your hands thoroughly throughout the day as this is the number one way to prevent the spread of infection and wipe down door knobs and counters with disinfectant.   Home Care: Only take medications as instructed by your medical team. Complete the entire course of an antibiotic. Do not take these medications with alcohol. A steam or ultrasonic humidifier can help congestion.  You can place a towel over your head and breathe in the steam from hot water coming from a faucet. Avoid close contacts especially the very young and the elderly. Cover your mouth when you cough or sneeze. Always remember to wash your hands.  Get Help Right Away If: You develop worsening fever or sinus pain. You develop a severe head ache or visual changes. Your symptoms persist after you have completed your treatment plan.  Make sure you Understand these instructions. Will watch your condition. Will get help right away if you are not doing well or get  worse.   Thank you for choosing an e-visit.  Your e-visit answers were reviewed by a board certified advanced clinical practitioner to complete your personal care plan. Depending upon the condition, your plan could have included both over the counter or prescription medications.  Please review your pharmacy choice. Make sure the pharmacy is open so you can pick up prescription now. If there is a problem, you may contact your provider through MyChart messaging and have the prescription routed to another pharmacy.  Your safety is important to us. If you have drug allergies check your prescription carefully.   For the next 24 hours you can use MyChart to ask questions about today's visit, request a non-urgent call back, or ask for a work or school excuse. You will get an email in the next two days asking about your experience. I hope that your e-visit has been valuable and will speed your recovery.  I provided 5 minutes of non face-to-face time during this encounter for chart review, medication and order placement, as well as and documentation.   

## 2023-01-04 ENCOUNTER — Telehealth: Payer: Medicaid Other | Admitting: Nurse Practitioner

## 2023-01-04 DIAGNOSIS — B9689 Other specified bacterial agents as the cause of diseases classified elsewhere: Secondary | ICD-10-CM | POA: Diagnosis not present

## 2023-01-04 DIAGNOSIS — N760 Acute vaginitis: Secondary | ICD-10-CM

## 2023-01-04 MED ORDER — METRONIDAZOLE 500 MG PO TABS
500.0000 mg | ORAL_TABLET | Freq: Two times a day (BID) | ORAL | 0 refills | Status: AC
Start: 2023-01-04 — End: 2023-01-11

## 2023-01-04 NOTE — Progress Notes (Signed)
E-Visit for Vaginal Symptoms  We are sorry that you are not feeling well. Here is how we plan to help! Based on what you shared with me it looks like you: May have a vaginosis due to bacteria  Vaginosis is an inflammation of the vagina that can result in discharge, itching and pain. The cause is usually a change in the normal balance of vaginal bacteria or an infection. Vaginosis can also result from reduced estrogen levels after menopause.  The most common causes of vaginosis are:   Bacterial vaginosis which results from an overgrowth of one on several organisms that are normally present in your vagina.   Yeast infections which are caused by a naturally occurring fungus called candida.   Vaginal atrophy (atrophic vaginosis) which results from the thinning of the vagina from reduced estrogen levels after menopause.   Trichomoniasis which is caused by a parasite and is commonly transmitted by sexual intercourse.  Factors that increase your risk of developing vaginosis include: Medications, such as antibiotics and steroids Uncontrolled diabetes Use of hygiene products such as bubble bath, vaginal spray or vaginal deodorant Douching Wearing damp or tight-fitting clothing Using an intrauterine device (IUD) for birth control Hormonal changes, such as those associated with pregnancy, birth control pills or menopause Sexual activity Having a sexually transmitted infection  Your treatment plan is Metronidazole or Flagyl 500mg twice a day for 7 days.  I have electronically sent this prescription into the pharmacy that you have chosen.  Be sure to take all of the medication as directed. Stop taking any medication if you develop a rash, tongue swelling or shortness of breath. Mothers who are breast feeding should consider pumping and discarding their breast milk while on these antibiotics. However, there is no consensus that infant exposure at these doses would be harmful.  Remember that  medication creams can weaken latex condoms. .   HOME CARE:  Good hygiene may prevent some types of vaginosis from recurring and may relieve some symptoms:  Avoid baths, hot tubs and whirlpool spas. Rinse soap from your outer genital area after a shower, and dry the area well to prevent irritation. Don't use scented or harsh soaps, such as those with deodorant or antibacterial action. Avoid irritants. These include scented tampons and pads. Wipe from front to back after using the toilet. Doing so avoids spreading fecal bacteria to your vagina.  Other things that may help prevent vaginosis include:  Don't douche. Your vagina doesn't require cleansing other than normal bathing. Repetitive douching disrupts the normal organisms that reside in the vagina and can actually increase your risk of vaginal infection. Douching won't clear up a vaginal infection. Use a latex condom. Both female and female latex condoms may help you avoid infections spread by sexual contact. Wear cotton underwear. Also wear pantyhose with a cotton crotch. If you feel comfortable without it, skip wearing underwear to bed. Yeast thrives in moist environments Your symptoms should improve in the next day or two.  GET HELP RIGHT AWAY IF:  You have pain in your lower abdomen ( pelvic area or over your ovaries) You develop nausea or vomiting You develop a fever Your discharge changes or worsens You have persistent pain with intercourse You develop shortness of breath, a rapid pulse, or you faint.  These symptoms could be signs of problems or infections that need to be evaluated by a medical provider now.  MAKE SURE YOU   Understand these instructions. Will watch your condition. Will get help right   away if you are not doing well or get worse.  Thank you for choosing an e-visit.  Your e-visit answers were reviewed by a board certified advanced clinical practitioner to complete your personal care plan. Depending upon the  condition, your plan could have included both over the counter or prescription medications.  Please review your pharmacy choice. Make sure the pharmacy is open so you can pick up prescription now. If there is a problem, you may contact your provider through MyChart messaging and have the prescription routed to another pharmacy.  Your safety is important to us. If you have drug allergies check your prescription carefully.   For the next 24 hours you can use MyChart to ask questions about today's visit, request a non-urgent call back, or ask for a work or school excuse. You will get an email in the next two days asking about your experience. I hope that your e-visit has been valuable and will speed your recovery.   Meds ordered this encounter  Medications   metroNIDAZOLE (FLAGYL) 500 MG tablet    Sig: Take 1 tablet (500 mg total) by mouth 2 (two) times daily for 7 days.    Dispense:  14 tablet    Refill:  0     I spent approximately 5 minutes reviewing the patient's history, current symptoms and coordinating their care today.   

## 2023-01-19 DIAGNOSIS — D067 Carcinoma in situ of other parts of cervix: Secondary | ICD-10-CM | POA: Diagnosis not present

## 2023-01-19 DIAGNOSIS — Z3041 Encounter for surveillance of contraceptive pills: Secondary | ICD-10-CM | POA: Diagnosis not present

## 2023-01-19 DIAGNOSIS — B9689 Other specified bacterial agents as the cause of diseases classified elsewhere: Secondary | ICD-10-CM | POA: Diagnosis not present

## 2023-01-19 DIAGNOSIS — N760 Acute vaginitis: Secondary | ICD-10-CM | POA: Diagnosis not present

## 2023-03-23 DIAGNOSIS — N898 Other specified noninflammatory disorders of vagina: Secondary | ICD-10-CM

## 2023-03-24 NOTE — Progress Notes (Signed)
Because of vaginal discharge/symptoms starting after intercourse with a new partner, I feel your condition warrants further evaluation and I recommend that you be seen in a face to face visit, as a precaution to make sure nothing more significant present.    NOTE: There will be NO CHARGE for this eVisit   If you are having a true medical emergency please call 911.      For an urgent face to face visit, [facility] has eight urgent care centers for your convenience:   NEW!! [facility] Health Urgent Care Center at [facility] Get Driving Directions [phone] [address], [address] [place], [address]    [facility] Health Urgent Care Center at [facility] Get Driving Directions [phone] [address] [place], [place] [address]   [facility] Health Urgent Care Center [facility]) Get Driving Directions [phone] [address] [place], [place] [address]  [facility] Health Urgent Care Center [facility]) Get Driving Directions [phone] [address] [place],  [place]  [address]  [facility] Health Urgent Care Center [facility] - at [employer]  [phone] [address] [place],  [place] [address]   [facility] Health Urgent Care at [facility] Get Driving Directions [phone] [address], [address] [place], [place] [address]   [facility] Health Urgent Care at [facility] Get Driving Directions  [phone] [address] [place], [place] [address]   [facility] Health Urgent Care at [facility] Directions [phone] [address], [address] [place], [place] [address]  Your MyChart E-visit questionnaire answers were reviewed by a board certified advanced clinical practitioner to complete your personal care plan based on your specific symptoms.  Thank you for using e-Visits.

## 2023-03-31 ENCOUNTER — Telehealth: Payer: Medicaid Other | Admitting: Physician Assistant

## 2023-03-31 DIAGNOSIS — R3989 Other symptoms and signs involving the genitourinary system: Secondary | ICD-10-CM

## 2023-03-31 MED ORDER — NITROFURANTOIN MONOHYD MACRO 100 MG PO CAPS
100.0000 mg | ORAL_CAPSULE | Freq: Two times a day (BID) | ORAL | 0 refills | Status: DC
Start: 2023-03-31 — End: 2023-06-17

## 2023-03-31 NOTE — Progress Notes (Signed)

## 2023-04-07 ENCOUNTER — Telehealth: Payer: Self-pay | Admitting: *Deleted

## 2023-04-07 NOTE — Patient Outreach (Signed)
  Care Management   Note  04/07/2023 Name: Tanya Mccullough MRN: [id number] DOB: 19-Apr-1991  [name] is enrolled in a Managed Medicaid plan: Yes. Outreach attempt today was successful.   The patient was given information about care management services as a benefit of their Medicaid health plan today.    Patient                                              agreed to services and verbal consent obtained.    An initial telephone outreach has been scheduled for: 04/13/23 at 10:30am  Estanislado Emms RN, BSN [place]  Managed [facility] RN Care Coordinator [phone]

## 2023-04-13 ENCOUNTER — Other Ambulatory Visit: Payer: Medicaid Other | Admitting: *Deleted

## 2023-04-13 NOTE — Patient Outreach (Signed)
  Medicaid Managed Care   Unsuccessful Attempt Note   04/13/2023 Name: Tanya Mccullough MRN: [id number] DOB: 28-Oct-1990  Referred by: Arvilla Market, MD (Inactive) Reason for referral : High Risk Managed Medicaid (Unsuccessful RNCM initial telephone outreach)   An unsuccessful telephone outreach was attempted today. The patient was referred to the case management team for assistance with care management and care coordination.    Follow Up Plan: A HIPAA compliant phone message was left for the patient providing contact information and requesting a return call. and The Managed Medicaid care management team will reach out to the patient again over the next 7 days.    Estanislado Emms RN, BSN [place]  [facility] RN Care Coordinator [phone]

## 2023-04-13 NOTE — Patient Instructions (Signed)
Visit Information  [name]  - as a part of your Medicaid benefit, you are eligible for care management and care coordination services at no cost or copay. I was unable to reach you by phone today but would be happy to help you with your health related needs. Please feel free to call me @ [phone].   A member of the Managed Medicaid care management team will reach out to you again over the next 7 days.   Estanislado Emms RN, BSN [place]  [facility] RN Care Coordinator [phone]

## 2023-04-20 ENCOUNTER — Ambulatory Visit: Payer: Medicaid Other | Admitting: Family Medicine

## 2023-04-20 ENCOUNTER — Encounter: Payer: Self-pay | Admitting: Family Medicine

## 2023-04-23 ENCOUNTER — Other Ambulatory Visit: Payer: Medicaid Other | Admitting: *Deleted

## 2023-04-23 NOTE — Patient Outreach (Signed)
Care Coordination  04/23/2023  Brissa Solis 13-Jan-1991 [id number]   Successful telephone outreach with Ms. Keaveney today. RNCM scheduled for an initial visit. Patient shared with [facility] that she had a visit to establish care with new PCP this morning. Patient no longer has [place] PCP. RNCM explained to patient to contact [facility] and update PCP information. RNCM explained that patient can reach out to PCP or [facility] to request case management. Patient voiced understanding.  Estanislado Emms RN, BSN [place]  Value[facility]Based Care Institute [facility] RN Care Coordinator [phone]

## 2023-06-17 ENCOUNTER — Telehealth: Payer: Medicaid Other | Admitting: Physician Assistant

## 2023-06-17 DIAGNOSIS — T63441A Toxic effect of venom of bees, accidental (unintentional), initial encounter: Secondary | ICD-10-CM

## 2023-06-17 NOTE — Progress Notes (Signed)
E-Visit for Insect Sting  Thank you for describing the insect sting for [place].  Here is how we plan to help!  An uncomplicated insect sting that just occurred and can be closely followed using the instructions in your care plan.  The 2 greatest risks from insect stings are allergic reaction, which can be fatal in some people and infection, which is more common and less serious.  Bees, wasps, yellow jackets, and hornets belong to a class of insects called Hymenoptera.  Most insect stings cause only minor discomfort.  Stings can happen anywhere on the body and can be painful.  Most stings are from honey bees or yellow jackets.  Fire ants can sting multiple times.  The sites of the stings are more likely to become infected.    Based on your information I have: and Provided a home care guide for insect stings and instructions on when to call for help.  What can be used to prevent Insect Stings?  Insect repellant with at least 20% DEET.  Wearing long pants and shirts with socks and shoes.  Wear dark or drab-colored clothes rather than bright colors.  Avoid using perfumes and hair sprays; these attract insects.  HOME CARE ADVICE:  1. Stinger removal: The stinger looks like a tiny black dot in the sting. Use a fingernail, credit card edge, or knife-edge to scrape it off.  Don't pull it out because it squeezes out more venom. If the stinger is below the skin surface, leave it alone.  It will be shed with normal skin healing. 2. Use cold compresses to the area of the sting for 10-20 minutes.  You may repeat this as needed to relieve symptoms of pain and swelling. 3.  For pain relief, take acetominophen 650 mg 4-6 hours as needed or ibuprofen 400 mg every 6-8 hours as needed or naproxen 250-500 mg every 12 hours as needed. 4.  You can also use hydrocortisone cream 0.5% or 1% up to 4 times daily as needed for itching. 5.  If the sting becomes very itchy, take Benadryl 25-50 mg, follow directions on  box. 6.  Wash the area 2-3 times daily with antibacterial soap and warm water.  7. Call your Doctor if: Fever, a severe headache, or rash occur in the next 2 weeks. Sting area begins to look infected. Redness and swelling worsens after home treatment. Your current symptoms become worse.    MAKE SURE YOU:  Understand these instructions. Will watch your condition. Will get help right away if you are not doing well or get worse.  Thank you for choosing an e-visit.  Your e-visit answers were reviewed by a board certified advanced clinical practitioner to complete your personal care plan. Depending upon the condition, your plan could have included both over the counter or prescription medications.  Please review your pharmacy choice. Make sure the pharmacy is open so you can pick up prescription now. If there is a problem, you may contact your provider through [employer] and have the prescription routed to another pharmacy.  Your safety is important to [place]. If you have drug allergies check your prescription carefully.   For the next 24 hours you can use MyChart to ask questions about today's visit, request a non-urgent call back, or ask for a work or school excuse. You will get an email in the next two days asking about your experience. I hope that your e-visit has been valuable and will speed your recovery.

## 2023-06-17 NOTE — Progress Notes (Signed)
I have spent 5 minutes in review of e-visit questionnaire, review and updating patient chart, medical decision making and response to patient.   Mia Milan Cody Jacklynn Dehaas, PA-C    

## 2024-01-19 ENCOUNTER — Telehealth: Admitting: Physician Assistant

## 2024-01-19 DIAGNOSIS — B379 Candidiasis, unspecified: Secondary | ICD-10-CM | POA: Diagnosis not present

## 2024-01-19 DIAGNOSIS — N760 Acute vaginitis: Secondary | ICD-10-CM

## 2024-01-19 DIAGNOSIS — B9689 Other specified bacterial agents as the cause of diseases classified elsewhere: Secondary | ICD-10-CM | POA: Diagnosis not present

## 2024-01-19 DIAGNOSIS — T3695XA Adverse effect of unspecified systemic antibiotic, initial encounter: Secondary | ICD-10-CM | POA: Diagnosis not present

## 2024-01-19 MED ORDER — METRONIDAZOLE 0.75 % VA GEL: 1.0000 | Freq: Every day | VAGINAL | 0 refills | Status: AC

## 2024-01-19 MED ORDER — FLUCONAZOLE 150 MG PO TABS: 150.0000 mg | ORAL_TABLET | ORAL | 0 refills | Status: AC | PRN

## 2024-01-19 NOTE — Progress Notes (Signed)
 E-Visit for Vaginal Symptoms  We are sorry that you are not feeling well. Here is how we plan to help! Based on what you shared with me it looks like you: May have a vaginosis due to bacteria  Vaginosis is an inflammation of the vagina that can result in discharge, itching and pain. The cause is usually a change in the normal balance of vaginal bacteria or an infection. Vaginosis can also result from reduced estrogen levels after menopause.  The most common causes of vaginosis are:   Bacterial vaginosis which results from an overgrowth of one on several organisms that are normally present in your vagina.   Yeast infections which are caused by a naturally occurring fungus called candida.   Vaginal atrophy (atrophic vaginosis) which results from the thinning of the vagina from reduced estrogen levels after menopause.   Trichomoniasis which is caused by a parasite and is commonly transmitted by sexual intercourse.  Factors that increase your risk of developing vaginosis include: Medications, such as antibiotics and steroids Uncontrolled diabetes Use of hygiene products such as bubble bath, vaginal spray or vaginal deodorant Douching Wearing damp or tight-fitting clothing Using an intrauterine device (IUD) for birth control Hormonal changes, such as those associated with pregnancy, birth control pills or menopause Sexual activity Having a sexually transmitted infection  Your treatment plan is Metronidazole  0.75% vaginal gel Use one applicatorful once a day at bedtime for 7 days.  I have electronically sent this prescription into the pharmacy that you have chosen.  Be sure to take all of the medication as directed. Stop taking any medication if you develop a rash, tongue swelling or shortness of breath. Mothers who are breast feeding should consider pumping and discarding their breast milk while on these antibiotics. However, there is no consensus that infant exposure at these doses would be  harmful.  Remember that medication creams can weaken latex condoms.  Diflucan  given as prophylaxis as patient tends to get vaginal yeast infections with antibiotic use.  HOME CARE:  Good hygiene may prevent some types of vaginosis from recurring and may relieve some symptoms:  Avoid baths, hot tubs and whirlpool spas. Rinse soap from your outer genital area after a shower, and dry the area well to prevent irritation. Don't use scented or harsh soaps, such as those with deodorant or antibacterial action. Avoid irritants. These include scented tampons and pads. Wipe from front to back after using the toilet. Doing so avoids spreading fecal bacteria to your vagina.  Other things that may help prevent vaginosis include:  Don't douche. Your vagina doesn't require cleansing other than normal bathing. Repetitive douching disrupts the normal organisms that reside in the vagina and can actually increase your risk of vaginal infection. Douching won't clear up a vaginal infection. Use a latex condom. Both female and female latex condoms may help you avoid infections spread by sexual contact. Wear cotton underwear. Also wear pantyhose with a cotton crotch. If you feel comfortable without it, skip wearing underwear to bed. Yeast thrives in [employer] Your symptoms should improve in the next day or two.  GET HELP RIGHT AWAY IF:  You have pain in your lower abdomen ( pelvic area or over your ovaries) You develop nausea or vomiting You develop a fever Your discharge changes or worsens You have persistent pain with intercourse You develop shortness of breath, a rapid pulse, or you faint.  These symptoms could be signs of problems or infections that need to be evaluated by a medical  provider now.  MAKE SURE YOU   Understand these instructions. Will watch your condition. Will get help right away if you are not doing well or get worse.  Thank you for choosing an e-visit.  Your e-visit  answers were reviewed by a board certified advanced clinical practitioner to complete your personal care plan. Depending upon the condition, your plan could have included both over the counter or prescription medications.  Please review your pharmacy choice. Make sure the pharmacy is open so you can pick up prescription now. If there is a problem, you may contact your provider through [employer] and have the prescription routed to another pharmacy.  Your safety is important to us . If you have drug allergies check your prescription carefully.   For the next 24 hours you can use MyChart to ask questions about today's visit, request a non-urgent call back, or ask for a work or school excuse. You will get an email in the next two days asking about your experience. I hope that your e-visit has been valuable and will speed your recovery.    I have spent 5 minutes in review of e-visit questionnaire, review and updating patient chart, medical decision making and response to patient.   Angelia Kelp, PA-C
